# Patient Record
Sex: Male | Born: 1970 | Race: White | Hispanic: No | Marital: Single | Smoking: Never smoker
Health system: Southern US, Community
[De-identification: ages and names within clinical notes are randomized; demographics above are authoritative.]

## PROBLEM LIST (undated history)

## (undated) DIAGNOSIS — I10 Essential (primary) hypertension: Secondary | ICD-10-CM

## (undated) DIAGNOSIS — K7030 Alcoholic cirrhosis of liver without ascites: Secondary | ICD-10-CM

## (undated) DIAGNOSIS — D649 Anemia, unspecified: Secondary | ICD-10-CM

## (undated) DIAGNOSIS — D689 Coagulation defect, unspecified: Secondary | ICD-10-CM

## (undated) DIAGNOSIS — R188 Other ascites: Secondary | ICD-10-CM

## (undated) DIAGNOSIS — K7010 Alcoholic hepatitis without ascites: Secondary | ICD-10-CM

## (undated) HISTORY — PX: APPENDECTOMY: SHX54

---

## 2011-10-20 ENCOUNTER — Emergency Department (HOSPITAL_COMMUNITY)
Admission: EM | Admit: 2011-10-20 | Discharge: 2011-10-20 | Disposition: A | Discharge status: Home / Self Care | Payer: Self-pay | Attending: Emergency Medicine | Admitting: Emergency Medicine

## 2011-10-20 ENCOUNTER — Encounter (HOSPITAL_COMMUNITY): Payer: Self-pay

## 2011-10-20 DIAGNOSIS — E876 Hypokalemia: Secondary | ICD-10-CM | POA: Insufficient documentation

## 2011-10-20 DIAGNOSIS — R42 Dizziness and giddiness: Secondary | ICD-10-CM | POA: Insufficient documentation

## 2011-10-20 DIAGNOSIS — I10 Essential (primary) hypertension: Secondary | ICD-10-CM | POA: Insufficient documentation

## 2011-10-20 DIAGNOSIS — R110 Nausea: Secondary | ICD-10-CM | POA: Insufficient documentation

## 2011-10-20 HISTORY — DX: Essential (primary) hypertension: I10

## 2011-10-20 LAB — CBC WITH DIFFERENTIAL/PLATELET
Basophils Relative: 0 % (ref 0–1)
Hemoglobin: 15.6 g/dL (ref 13.0–17.0)
Lymphs Abs: 2.3 10*3/uL (ref 0.7–4.0)
MCHC: 36 g/dL (ref 30.0–36.0)
Monocytes Relative: 7 % (ref 3–12)
Neutro Abs: 8.4 10*3/uL — ABNORMAL HIGH (ref 1.7–7.7)
Neutrophils Relative %: 72 % (ref 43–77)
Platelets: 279 10*3/uL (ref 150–400)
RBC: 4.16 MIL/uL — ABNORMAL LOW (ref 4.22–5.81)

## 2011-10-20 LAB — BASIC METABOLIC PANEL
BUN: 18 mg/dL (ref 6–23)
Chloride: 92 mEq/L — ABNORMAL LOW (ref 96–112)
GFR calc Af Amer: >90 mL/min (ref >90)
Potassium: 3.2 mEq/L — ABNORMAL LOW (ref 3.5–5.1)
Sodium: 133 mEq/L — ABNORMAL LOW (ref 135–145)

## 2011-10-20 MED ORDER — ONDANSETRON HCL 4 MG/2ML IJ SOLN
4.0000 mg | Freq: Once | INTRAMUSCULAR | Status: AC
  Administered 2011-10-20: 4 mg via INTRAVENOUS
  Filled 2011-10-20: qty 2

## 2011-10-20 MED ORDER — SODIUM CHLORIDE 0.9 % IV BOLUS (SEPSIS)
1000.0000 mL | Freq: Once | INTRAVENOUS | Status: AC
  Administered 2011-10-20: 1000 mL via INTRAVENOUS

## 2011-10-20 MED ORDER — CLONIDINE HCL 0.2 MG PO TABS
0.2000 mg | ORAL_TABLET | Freq: Once | ORAL | Status: AC
  Administered 2011-10-20: 0.2 mg via ORAL
  Filled 2011-10-20: qty 1

## 2011-10-20 NOTE — ED Notes (Signed)
MD at bedside. 

## 2011-10-20 NOTE — ED Notes (Signed)
Pt c/o nausea for the past month off and on.  Reports today felt dizzy as well.  Pt vomited x 1 in ED.  Pt says feels like he gets overheated at work.  Pt also reports has had productive cough for the past month.  Denies chest pain.

## 2011-10-20 NOTE — ED Provider Notes (Signed)
History   . This chart was scribed for Donnetta Hutching, MD, MD by Smitty Pluck. The patient was seen in room APA06 and the patient's care was started at 12:56PM.   CSN: [id number]  Arrival date & time 10/20/11  1057   First MD Initiated Contact with Patient 10/20/11 1256      Chief Complaint  Patient presents with  . Dizziness  . Nausea    (Consider location/radiation/quality/duration/timing/severity/associated sxs/prior treatment) The history is provided by the patient. No language interpreter was used.   Colin Hodges is a 41 y.o. male who presents to the Emergency Department complaining of moderate dizziness at work today. Dizziness occurs when he stands up or moves. Pt reports lying down alleviates the dizziness. He reports shaking and sweating. Pt reports that he was working on hard wood floors when symptoms occurred. He awoke this AM feeling normal. Pt reports that he takes lisinopril (20-25 mg) for HTN. He did not take HTN medication 1 day ago. He states that he vomited today when he tried to take medication. Pt's current BP is 168/100.  Past Medical History  Diagnosis Date  . Hypertension     Past Surgical History  Procedure Date  . Appendectomy     No family history on file.  History  Substance Use Topics  . Smoking status: Never Smoker   . Smokeless tobacco: Not on file  . Alcohol Use: Yes     occ      Review of Systems  All other systems reviewed and are negative.   10 Systems reviewed and all are negative for acute change except as noted in the HPI.   Allergies  Review of patient's allergies indicates no known allergies.  Home Medications   Current Outpatient Rx  Name Route Sig Dispense Refill  . LISINOPRIL-HYDROCHLOROTHIAZIDE 20-25 MG PO TABS Oral Take 1 tablet by mouth daily.      BP 168/100  Pulse 109  Temp 98.1 F (36.7 C) (Oral)  Resp 20  Ht 5\' 10"  (1.778 m)  Wt 250 lb (113.399 kg)  BMI 35.87 kg/m2  SpO2 97%  Physical Exam  Nursing  note and vitals reviewed. Constitutional: He is oriented to person, place, and time. He appears well-developed and well-nourished.  HENT:  Head: Normocephalic and atraumatic.  Eyes: Conjunctivae and EOM are normal. Pupils are equal, round, and reactive to light.  Neck: Normal range of motion. Neck supple.  Cardiovascular: Normal rate, regular rhythm and normal heart sounds.   Pulmonary/Chest: Effort normal and breath sounds normal.  Abdominal: Soft. Bowel sounds are normal.  Musculoskeletal: Normal range of motion.  Neurological: He is alert and oriented to person, place, and time.  Skin: Skin is warm and dry.  Psychiatric: He has a normal mood and affect.    ED Course  Procedures (including critical care time) DIAGNOSTIC STUDIES: Oxygen Saturation is 97% on room air, normal by my interpretation.    COORDINATION OF CARE: 1:02 PM Discussed pt ED treatment with pt (clonidine, IV fluids, Zofran and labs)  1:07 PM Ordered:   Medications  lisinopril-hydrochlorothiazide (PRINZIDE,ZESTORETIC) 20-25 MG per tablet (not administered)  ondansetron (ZOFRAN) injection 4 mg (not administered)  sodium chloride 0.9 % bolus 1,000 mL (not administered)       Labs Reviewed  CBC WITH DIFFERENTIAL - Abnormal; Notable for the following:    WBC 11.6 (*)     RBC 4.16 (*)     MCV 104.1 (*)     MCH 37.5 (*)  Neutro Abs 8.4 (*)     All other components within normal limits  BASIC METABOLIC PANEL - Abnormal; Notable for the following:    Sodium 133 (*)     Potassium 3.2 (*)     Chloride 92 (*)     Glucose, Bld 135 (*)     All other components within normal limits  TROPONIN I   No results found.   No diagnosis found.  Date: 10/20/2011  Rate:111  Rhythm: sinus tachycardia  QRS Axis: normal  Intervals: normal  ST/T Wave abnormalities: normal  Conduction Disutrbances:none  Narrative Interpretation:   Old EKG Reviewed: none available    MDM  Recheck at 1500:  Feeling much better.  Blood pressure has reduced. Discussed taking blood pressure medicine correctly and reducing alcohol consumption. No clinical evidence of stroke  I personally performed the services described in this documentation, which was scribed in my presence. The recorded information has been reviewed and considered.        Donnetta Hutching, MD 10/20/11 [id number]

## 2011-10-20 NOTE — ED Notes (Signed)
Left in c/o father for transport home; instructions reviewed and f/u information provided.  In no distress; reports "a little" dizzy, but states is much-improved from earlier. A&ox4; answers questions appropriately; in no distress.

## 2013-02-09 DIAGNOSIS — R188 Other ascites: Secondary | ICD-10-CM

## 2013-02-09 DIAGNOSIS — K7030 Alcoholic cirrhosis of liver without ascites: Secondary | ICD-10-CM

## 2013-02-09 HISTORY — DX: Alcoholic cirrhosis of liver without ascites: K70.30

## 2013-02-09 HISTORY — DX: Other ascites: R18.8

## 2014-01-08 ENCOUNTER — Encounter (HOSPITAL_COMMUNITY): Payer: Self-pay

## 2014-01-08 ENCOUNTER — Inpatient Hospital Stay (HOSPITAL_COMMUNITY)
Admission: EM | Admit: 2014-01-08 | Discharge: 2014-01-14 | DRG: 377 | Disposition: A | Discharge status: Home / Self Care | Payer: Self-pay | Attending: Family Medicine | Admitting: Family Medicine

## 2014-01-08 ENCOUNTER — Inpatient Hospital Stay (HOSPITAL_COMMUNITY): Payer: Self-pay

## 2014-01-08 DIAGNOSIS — I864 Gastric varices: Secondary | ICD-10-CM | POA: Diagnosis present

## 2014-01-08 DIAGNOSIS — D7589 Other specified diseases of blood and blood-forming organs: Secondary | ICD-10-CM | POA: Diagnosis present

## 2014-01-08 DIAGNOSIS — K922 Gastrointestinal hemorrhage, unspecified: Secondary | ICD-10-CM | POA: Diagnosis present

## 2014-01-08 DIAGNOSIS — K766 Portal hypertension: Secondary | ICD-10-CM | POA: Diagnosis present

## 2014-01-08 DIAGNOSIS — D689 Coagulation defect, unspecified: Secondary | ICD-10-CM | POA: Diagnosis present

## 2014-01-08 DIAGNOSIS — I10 Essential (primary) hypertension: Secondary | ICD-10-CM | POA: Diagnosis present

## 2014-01-08 DIAGNOSIS — R17 Unspecified jaundice: Secondary | ICD-10-CM | POA: Diagnosis present

## 2014-01-08 DIAGNOSIS — R112 Nausea with vomiting, unspecified: Secondary | ICD-10-CM | POA: Diagnosis present

## 2014-01-08 DIAGNOSIS — R258 Other abnormal involuntary movements: Secondary | ICD-10-CM

## 2014-01-08 DIAGNOSIS — F1010 Alcohol abuse, uncomplicated: Secondary | ICD-10-CM | POA: Diagnosis present

## 2014-01-08 DIAGNOSIS — F1011 Alcohol abuse, in remission: Secondary | ICD-10-CM | POA: Diagnosis present

## 2014-01-08 DIAGNOSIS — Z79899 Other long term (current) drug therapy: Secondary | ICD-10-CM

## 2014-01-08 DIAGNOSIS — K769 Liver disease, unspecified: Secondary | ICD-10-CM

## 2014-01-08 DIAGNOSIS — R7989 Other specified abnormal findings of blood chemistry: Secondary | ICD-10-CM | POA: Insufficient documentation

## 2014-01-08 DIAGNOSIS — K3189 Other diseases of stomach and duodenum: Secondary | ICD-10-CM | POA: Diagnosis present

## 2014-01-08 DIAGNOSIS — K7011 Alcoholic hepatitis with ascites: Secondary | ICD-10-CM | POA: Insufficient documentation

## 2014-01-08 DIAGNOSIS — N390 Urinary tract infection, site not specified: Secondary | ICD-10-CM | POA: Diagnosis present

## 2014-01-08 DIAGNOSIS — K859 Acute pancreatitis, unspecified: Secondary | ICD-10-CM | POA: Diagnosis present

## 2014-01-08 DIAGNOSIS — K449 Diaphragmatic hernia without obstruction or gangrene: Secondary | ICD-10-CM | POA: Diagnosis present

## 2014-01-08 DIAGNOSIS — I471 Supraventricular tachycardia: Secondary | ICD-10-CM

## 2014-01-08 DIAGNOSIS — R945 Abnormal results of liver function studies: Secondary | ICD-10-CM

## 2014-01-08 DIAGNOSIS — K921 Melena: Principal | ICD-10-CM | POA: Diagnosis present

## 2014-01-08 DIAGNOSIS — K2270 Barrett's esophagus without dysplasia: Secondary | ICD-10-CM | POA: Diagnosis present

## 2014-01-08 DIAGNOSIS — R188 Other ascites: Secondary | ICD-10-CM | POA: Diagnosis present

## 2014-01-08 HISTORY — DX: Alcoholic hepatitis without ascites: K70.10

## 2014-01-08 LAB — CBC WITH DIFFERENTIAL/PLATELET
BASOS ABS: 0 10*3/uL (ref 0.0–0.1)
BASOS PCT: 0 % (ref 0–1)
BASOS PCT: 0 % (ref 0–1)
Basophils Absolute: 0 10*3/uL (ref 0.0–0.1)
EOS PCT: 0 % (ref 0–5)
EOS PCT: 0 % (ref 0–5)
Eosinophils Absolute: 0 10*3/uL (ref 0.0–0.7)
Eosinophils Absolute: 0 10*3/uL (ref 0.0–0.7)
HEMATOCRIT: 35.3 % — AB (ref 39.0–52.0)
HEMATOCRIT: 32.1 % — AB (ref 39.0–52.0)
Hemoglobin: 12.4 g/dL — ABNORMAL LOW (ref 13.0–17.0)
Hemoglobin: 11.3 g/dL — ABNORMAL LOW (ref 13.0–17.0)
LYMPHS PCT: 12 % (ref 12–46)
Lymphocytes Relative: 10 % — ABNORMAL LOW (ref 12–46)
Lymphs Abs: 1.3 10*3/uL (ref 0.7–4.0)
Lymphs Abs: 1.3 10*3/uL (ref 0.7–4.0)
MCH: 38.6 pg — ABNORMAL HIGH (ref 26.0–34.0)
MCH: 38.8 pg — ABNORMAL HIGH (ref 26.0–34.0)
MCHC: 35.1 g/dL (ref 30.0–36.0)
MCHC: 35.2 g/dL (ref 30.0–36.0)
MCV: 110 fL — AB (ref 78.0–100.0)
MCV: 110.3 fL — ABNORMAL HIGH (ref 78.0–100.0)
MONO ABS: 1 10*3/uL (ref 0.1–1.0)
MONO ABS: 0.8 10*3/uL (ref 0.1–1.0)
Monocytes Relative: 8 % (ref 3–12)
Monocytes Relative: 8 % (ref 3–12)
Neutro Abs: 10.4 10*3/uL — ABNORMAL HIGH (ref 1.7–7.7)
Neutro Abs: 8.3 10*3/uL — ABNORMAL HIGH (ref 1.7–7.7)
Neutrophils Relative %: 82 % — ABNORMAL HIGH (ref 43–77)
Neutrophils Relative %: 80 % — ABNORMAL HIGH (ref 43–77)
PLATELETS: 211 10*3/uL (ref 150–400)
Platelets: 228 10*3/uL (ref 150–400)
RBC: 3.21 MIL/uL — ABNORMAL LOW (ref 4.22–5.81)
RBC: 2.91 MIL/uL — ABNORMAL LOW (ref 4.22–5.81)
RDW: 14.8 % (ref 11.5–15.5)
RDW: 15 % (ref 11.5–15.5)
WBC: 12.7 10*3/uL — ABNORMAL HIGH (ref 4.0–10.5)
WBC: 10.4 10*3/uL (ref 4.0–10.5)

## 2014-01-08 LAB — URINALYSIS, ROUTINE W REFLEX MICROSCOPIC
GLUCOSE, UA: NEGATIVE mg/dL
Hgb urine dipstick: NEGATIVE
Ketones, ur: 15 mg/dL — AB
Nitrite: POSITIVE — AB
PH: 5.5 (ref 5.0–8.0)
PROTEIN: 30 mg/dL — AB
Specific Gravity, Urine: 1.037 — ABNORMAL HIGH (ref 1.005–1.030)
Urobilinogen, UA: 1 mg/dL (ref 0.0–1.0)

## 2014-01-08 LAB — COMPREHENSIVE METABOLIC PANEL
ALBUMIN: 2.7 g/dL — AB (ref 3.5–5.2)
ALT: 35 U/L (ref 0–53)
AST: 126 U/L — AB (ref 0–37)
Alkaline Phosphatase: 149 U/L — ABNORMAL HIGH (ref 39–117)
Anion gap: 19 — ABNORMAL HIGH (ref 5–15)
BUN: 11 mg/dL (ref 6–23)
CALCIUM: 8.8 mg/dL (ref 8.4–10.5)
CO2: 24 mEq/L (ref 19–32)
CREATININE: 0.45 mg/dL — AB (ref 0.50–1.35)
Chloride: 91 mEq/L — ABNORMAL LOW (ref 96–112)
GFR calc Af Amer: >90 mL/min (ref >90)
GFR calc non Af Amer: >90 mL/min (ref >90)
Glucose, Bld: 88 mg/dL (ref 70–99)
Potassium: 3.7 mEq/L (ref 3.7–5.3)
Sodium: 134 mEq/L — ABNORMAL LOW (ref 137–147)
TOTAL PROTEIN: 6.9 g/dL (ref 6.0–8.3)
Total Bilirubin: 26.5 mg/dL (ref 0.3–1.2)

## 2014-01-08 LAB — TYPE AND SCREEN
ABO/RH(D): A POS
Antibody Screen: NEGATIVE

## 2014-01-08 LAB — PROTIME-INR
INR: 2.01 — AB (ref 0.00–1.49)
PROTHROMBIN TIME: 23 s — AB (ref 11.6–15.2)

## 2014-01-08 LAB — LIPASE, BLOOD: LIPASE: 44 U/L (ref 11–59)

## 2014-01-08 LAB — LACTIC ACID, PLASMA: Lactic Acid, Venous: 4.9 mmol/L — ABNORMAL HIGH (ref 0.5–2.2)

## 2014-01-08 LAB — URINE MICROSCOPIC-ADD ON

## 2014-01-08 LAB — POC OCCULT BLOOD, ED: FECAL OCCULT BLD: POSITIVE — AB

## 2014-01-08 LAB — I-STAT CG4 LACTIC ACID, ED: Lactic Acid, Venous: 5.02 mmol/L — ABNORMAL HIGH (ref 0.5–2.2)

## 2014-01-08 MED ORDER — PANTOPRAZOLE SODIUM 40 MG IV SOLR
40.0000 mg | Freq: Once | INTRAVENOUS | Status: AC
  Administered 2014-01-08: 40 mg via INTRAVENOUS
  Filled 2014-01-08: qty 40

## 2014-01-08 MED ORDER — DEXTROSE 5 % IV SOLN
1.0000 g | INTRAVENOUS | Status: DC
  Administered 2014-01-08 – 2014-01-13 (×6): 1 g via INTRAVENOUS
  Filled 2014-01-08 (×6): qty 10

## 2014-01-08 MED ORDER — LORAZEPAM 2 MG/ML IJ SOLN
0.0000 mg | Freq: Four times a day (QID) | INTRAMUSCULAR | Status: AC
  Administered 2014-01-08 – 2014-01-09 (×3): 1 mg via INTRAVENOUS
  Filled 2014-01-08 (×3): qty 1

## 2014-01-08 MED ORDER — SODIUM CHLORIDE 0.9 % IJ SOLN
3.0000 mL | Freq: Two times a day (BID) | INTRAMUSCULAR | Status: DC
  Administered 2014-01-10 – 2014-01-13 (×5): 3 mL via INTRAVENOUS

## 2014-01-08 MED ORDER — LIDOCAINE HCL 1 % IJ SOLN
INTRAMUSCULAR | Status: AC
  Administered 2014-01-08: 2 mL
  Filled 2014-01-08: qty 20

## 2014-01-08 MED ORDER — CEFTRIAXONE SODIUM 250 MG IJ SOLR
250.0000 mg | Freq: Once | INTRAMUSCULAR | Status: DC
  Filled 2014-01-08: qty 250

## 2014-01-08 MED ORDER — SODIUM CHLORIDE 0.9 % IV SOLN
25.0000 ug/h | INTRAVENOUS | Status: DC
  Administered 2014-01-09 – 2014-01-13 (×6): 25 ug/h via INTRAVENOUS
  Filled 2014-01-08 (×13): qty 1

## 2014-01-08 MED ORDER — ONDANSETRON HCL 4 MG/2ML IJ SOLN
4.0000 mg | Freq: Four times a day (QID) | INTRAMUSCULAR | Status: DC | PRN
  Administered 2014-01-12 – 2014-01-13 (×2): 4 mg via INTRAVENOUS
  Filled 2014-01-08 (×2): qty 2

## 2014-01-08 MED ORDER — PANTOPRAZOLE SODIUM 40 MG IV SOLR
40.0000 mg | Freq: Two times a day (BID) | INTRAVENOUS | Status: DC
  Administered 2014-01-09 (×2): 40 mg via INTRAVENOUS
  Filled 2014-01-08 (×3): qty 40

## 2014-01-08 MED ORDER — LORAZEPAM 1 MG PO TABS: 1.0000 mg | ORAL_TABLET | Freq: Four times a day (QID) | ORAL | Status: AC | PRN

## 2014-01-08 MED ORDER — ONDANSETRON HCL 4 MG PO TABS: 4.0000 mg | ORAL_TABLET | Freq: Four times a day (QID) | ORAL | Status: DC | PRN

## 2014-01-08 MED ORDER — FOLIC ACID 1 MG PO TABS
1.0000 mg | ORAL_TABLET | Freq: Every day | ORAL | Status: DC
  Administered 2014-01-08 – 2014-01-14 (×6): 1 mg via ORAL
  Filled 2014-01-08 (×7): qty 1

## 2014-01-08 MED ORDER — THIAMINE HCL 100 MG/ML IJ SOLN
Freq: Every day | INTRAVENOUS | Status: AC
  Administered 2014-01-08 – 2014-01-10 (×3): via INTRAVENOUS
  Filled 2014-01-08 (×4): qty 1000

## 2014-01-08 MED ORDER — ONDANSETRON HCL 4 MG/2ML IJ SOLN
4.0000 mg | Freq: Once | INTRAMUSCULAR | Status: AC
  Administered 2014-01-08: 4 mg via INTRAVENOUS
  Filled 2014-01-08: qty 2

## 2014-01-08 MED ORDER — ADULT MULTIVITAMIN W/MINERALS CH
1.0000 | ORAL_TABLET | Freq: Every day | ORAL | Status: DC
  Administered 2014-01-08 – 2014-01-14 (×6): 1 via ORAL
  Filled 2014-01-08 (×7): qty 1

## 2014-01-08 MED ORDER — LORAZEPAM 2 MG/ML IJ SOLN
1.0000 mg | Freq: Four times a day (QID) | INTRAMUSCULAR | Status: AC | PRN
  Administered 2014-01-09 – 2014-01-11 (×2): 1 mg via INTRAVENOUS
  Filled 2014-01-08 (×4): qty 1

## 2014-01-08 MED ORDER — LORAZEPAM 2 MG/ML IJ SOLN
0.0000 mg | Freq: Two times a day (BID) | INTRAMUSCULAR | Status: AC
  Administered 2014-01-10 – 2014-01-11 (×2): 1 mg via INTRAVENOUS
  Administered 2014-01-11 – 2014-01-12 (×2): 2 mg via INTRAVENOUS
  Filled 2014-01-08: qty 1

## 2014-01-08 MED ORDER — SODIUM CHLORIDE 0.9 % IV BOLUS (SEPSIS)
1000.0000 mL | Freq: Once | INTRAVENOUS | Status: AC
  Administered 2014-01-08: 1000 mL via INTRAVENOUS

## 2014-01-08 NOTE — ED Provider Notes (Signed)
CSN: [id number]     Arrival date & time 01/08/14  1558 History   First MD Initiated Contact with Patient 01/08/14 1648     Chief Complaint  Patient presents with  . Melena  . Abdominal Pain     (Consider location/radiation/quality/duration/timing/severity/associated sxs/prior Treatment) HPI Colin Hodges is a 43 y.o. male history of hypertension, heavy drinking history, comes in for evaluation of sudden onset bloating, yellow skin, and vomiting. Patient states he used to drink about a half gallon each day until this past year and has reduced his intake to half a pint or a pint every other day. He reports recently over the past week developing ascites with associated nausea and vomiting. States every time he tries to eat "normal-sized meal" he throws it back up. She also reports his skin and eyes have turned yellow over the past few days. He reports abdominal pain in his epigastrium as well as dark stools and dark urine. Denies fevers, diarrhea, constipation.  Past Medical History  Diagnosis Date  . Hypertension    Past Surgical History  Procedure Laterality Date  . Appendectomy     History reviewed. No pertinent family history. History  Substance Use Topics  . Smoking status: Never Smoker   . Smokeless tobacco: Not on file  . Alcohol Use: Yes     Comment: occ    Review of Systems  Constitutional: Negative for fever.  HENT: Negative for sore throat.   Eyes: Negative for visual disturbance.  Respiratory: Negative for shortness of breath.   Cardiovascular: Negative for chest pain.  Gastrointestinal: Positive for nausea, vomiting, abdominal pain and abdominal distention.       Dark stools  Endocrine: Negative for polyuria.  Genitourinary: Negative for dysuria.       Dark urine.  Skin: Negative for rash.  Neurological: Negative for headaches.      Allergies  Review of patient's allergies indicates no known allergies.  Home Medications   Prior to Admission medications    Medication Sig Start Date End Date Taking? Authorizing Provider  Cholecalciferol (VITAMIN D3) 5000 UNITS TABS Take 1 tablet by mouth 3 (three) times a week.   Yes Historical Provider, MD  Glucosamine-Chondroit-Vit C-Mn (GLUCOSAMINE 1500 COMPLEX PO) Take 1 tablet by mouth daily.   Yes Historical Provider, MD  lisinopril-hydrochlorothiazide (PRINZIDE,ZESTORETIC) 20-25 MG per tablet Take 1 tablet by mouth daily.   Yes Historical Provider, MD   BP 135/64 mmHg  Pulse 119  Temp(Src) 98.2 F (36.8 C) (Oral)  Resp 18  Ht 5' 10"  (1.778 m)  Wt 225 lb (102.059 kg)  BMI 32.28 kg/m2  SpO2 98% Physical Exam  Constitutional: He is oriented to person, place, and time. He appears well-developed and well-nourished. No distress.  Patient is alert, GCS 15.  HENT:  Head: Normocephalic and atraumatic.  Mouth/Throat: Oropharynx is clear and moist.  Eyes: Pupils are equal, round, and reactive to light. Right eye exhibits no discharge. Left eye exhibits no discharge.  Scleral icterus present bilaterally  Neck: Normal range of motion. Neck supple.  Cardiovascular: Regular rhythm and normal heart sounds.  Exam reveals no gallop and no friction rub.   No murmur heard. Tachycardic   Pulmonary/Chest: Effort normal and breath sounds normal. No respiratory distress. He has no wheezes. He has no rales.  Abdominal: Soft.  Abdomen is distended, consistent with ascites. Tenderness in the epigastrium. No guarding, no palpable masses.  Genitourinary:  Dark stool on rectal exam. Pt does have hemorrhoids, but no frank blood  appreciated.  Musculoskeletal: He exhibits no tenderness.  Neurological: He is alert and oriented to person, place, and time.  Cranial Nerves II-XII grossly intact  Skin: Skin is warm and dry. No rash noted. He is not diaphoretic.  Jaundice  Psychiatric: He has a normal mood and affect.  Nursing note and vitals reviewed.   ED Course  Procedures (including critical care time) Labs Review Labs  Reviewed  CBC WITH DIFFERENTIAL - Abnormal; Notable for the following:    WBC 12.7 (*)    RBC 3.21 (*)    Hemoglobin 12.4 (*)    HCT 35.3 (*)    MCV 110.0 (*)    MCH 38.6 (*)    Neutrophils Relative % 82 (*)    Neutro Abs 10.4 (*)    Lymphocytes Relative 10 (*)    All other components within normal limits  COMPREHENSIVE METABOLIC PANEL - Abnormal; Notable for the following:    Sodium 134 (*)    Chloride 91 (*)    Creatinine, Ser 0.45 (*)    Albumin 2.7 (*)    AST 126 (*)    Alkaline Phosphatase 149 (*)    Total Bilirubin 26.5 (*)    Anion gap 19 (*)    All other components within normal limits  URINALYSIS, ROUTINE W REFLEX MICROSCOPIC - Abnormal; Notable for the following:    Color, Urine ORANGE (*)    APPearance CLOUDY (*)    Specific Gravity, Urine 1.037 (*)    Bilirubin Urine LARGE (*)    Ketones, ur 15 (*)    Protein, ur 30 (*)    Nitrite POSITIVE (*)    Leukocytes, UA MODERATE (*)    All other components within normal limits  URINE MICROSCOPIC-ADD ON - Abnormal; Notable for the following:    Bacteria, UA FEW (*)    Casts HYALINE CASTS (*)    All other components within normal limits  I-STAT CG4 LACTIC ACID, ED - Abnormal; Notable for the following:    Lactic Acid, Venous 5.02 (*)    All other components within normal limits  POC OCCULT BLOOD, ED - Abnormal; Notable for the following:    Fecal Occult Bld POSITIVE (*)    All other components within normal limits  LIPASE, BLOOD    Imaging Review No results found.   EKG Interpretation None      Meds given in ED:  Medications  cefTRIAXone (ROCEPHIN) injection 250 mg (not administered)  pantoprazole (PROTONIX) injection 40 mg (not administered)  sodium chloride 0.9 % bolus 1,000 mL (0 mLs Intravenous Stopped 01/08/14 1854)  pantoprazole (PROTONIX) injection 40 mg (40 mg Intravenous Given 01/08/14 1800)  ondansetron (ZOFRAN) injection 4 mg (4 mg Intravenous Given 01/08/14 1759)    New Prescriptions   No  medications on file   Filed Vitals:   01/08/14 1900 01/08/14 1915 01/08/14 1930 01/08/14 1936  BP: 136/64  135/64 135/64  Pulse: 119 119 119 119  Temp:      TempSrc:      Resp:    18  Height:      Weight:      SpO2: 95% 95% 96% 98%    MDM  Colin Hodges is a 43 y.o. male history of hypertension, heavy drinking history, comes in for evaluation of sudden onset bloating, yellow skin, vomiting, dark stool and urine.  Pt feels well at this time, no current N/V. Hemoccult pos, Lactic acid 5.02. Epigastric tenderness, jaundiced. No active vomiting or frank blood. Nitrite pos, Rocephin  given in ED. Total bili 26.5,   Spoke with Dr. Posey Pronto, will admit to tele. Requested we speak with GI. Spoke with Dr. Collene Mares, GI, does not believe octreotide is necessary at this point, 80 of Protonix received in ED is sufficient, recommends CT abdomen tomorrow for better appreciation of ascites.  Final diagnoses:  Gastrointestinal hemorrhage with melena  Ascites  Jaundice        Verl Dicker, PA-C 01/09/14 [place], MD 01/09/14 2340

## 2014-01-08 NOTE — ED Notes (Signed)
Floor Unit staff was notified of pt's bed assignment at this time.

## 2014-01-08 NOTE — H&P (Signed)
[facility] History and Physical  Patient: Colin Hodges  YDX:[id number]  DOB: 12-07-1970  DOS: the patient was seen and examined on 01/08/2014 PCP: No primary care provider on file.  Chief Complaint: Nausea vomiting, cough, stomach distention, skin color changes  HPI: Colin Hodges is a 43 y.o. male with Past medical history of hypertension and alcohol abuse. The patient presents with complaints of nausea vomiting cough stomach distention and paresthesias. He drinks half a pint to a pint and a daily basis of VODKA, his last drink was yesterday afternoon. He has been drinking heavily over last 20 years. And has not been able to eat anything since last few days and does not have an balanced diet at his baseline. He mentions since last 6 months he has noted distention of his stomach progressively getting worse gradually. He also has sand-like sensation in his legs for last few months staying the same. He has been trying to cut back on his alcohol and has been having jitteriness and tremors every now and then. He has noted that his skin has been yellow over last few weeks and since last 2 nights his eyes have been progressively yellow which made him to come to the hospital.  Since last month he has been having on and off episodes of nausea and vomiting and mentions he is unable to eat regular size meal and has to eat in small portions. He also has noted cough associated with vomiting since last few weeks. He denies any diarrhea and has 1-2 daily bowel motions with regular consistency but mentions the color as dark maroon. He also mentions his vomitus has been occasionally dark color as well as occasional darker sputum with the cough. Last night he has significant bout of cough and started having complaints of pain in his lower ribs both sides and the pain has progressively worsened. Pain feels like sharp muscle aches. He denies any similar episode in the past. He denies any drug abuse,  Tylenol abuse, use of NSAIDs, use of any prescription medications or herbal supplements, smoking  The patient is coming from home. And at his baseline independent for most of his ADL.  Review of Systems: as mentioned in the history of present illness.  A Comprehensive review of the other systems is negative.  Past Medical History  Diagnosis Date  . Hypertension    Past Surgical History  Procedure Laterality Date  . Appendectomy     Social History:  reports that he has never smoked. He does not have any smokeless tobacco history on file. He reports that he drinks alcohol. He reports that he does not use illicit drugs.  No Known Allergies  History reviewed. No pertinent family history.  Prior to Admission medications   Medication Sig Start Date End Date Taking? Authorizing Provider  Cholecalciferol (VITAMIN D3) 5000 UNITS TABS Take 1 tablet by mouth 3 (three) times a week.   Yes Historical Provider, MD  Glucosamine-Chondroit-Vit C-Mn (GLUCOSAMINE 1500 COMPLEX PO) Take 1 tablet by mouth daily.   Yes Historical Provider, MD  lisinopril-hydrochlorothiazide (PRINZIDE,ZESTORETIC) 20-25 MG per tablet Take 1 tablet by mouth daily.   Yes Historical Provider, MD    Physical Exam: Filed Vitals:   01/08/14 1915 01/08/14 1930 01/08/14 1936 01/08/14 2028  BP:  135/64 135/64 136/73  Pulse: 119 119 119 126  Temp:      TempSrc:      Resp:   18 18  Height:      Weight:  SpO2: 95% 96% 98% 96%    General: Alert, Awake and Oriented to Time, Place and Person. Appear in mild distress Eyes: PERRL icterus present ENT: Oral Mucosa clear moist. Neck: No JVD Cardiovascular: S1 and S2 Present, no Murmur, Peripheral Pulses Present Respiratory: Bilateral Air entry equal and Decreased, Clear to Auscultation, no Crackles, no wheezes Abdomen: Bowel Sound present sluggish, Soft and no tender Skin: No Rash Extremities: Trace Pedal edema, no calf tenderness Neurologic: Grossly no focal neuro deficit  other than tremors.   Labs on Admission:  CBC:  Recent Labs Lab 01/08/14 1645  WBC 12.7*  NEUTROABS 10.4*  HGB 12.4*  HCT 35.3*  MCV 110.0*  PLT 228    CMP     Component Value Date/Time   NA 134* 01/08/2014 1645   K 3.7 01/08/2014 1645   CL 91* 01/08/2014 1645   CO2 24 01/08/2014 1645   GLUCOSE 88 01/08/2014 1645   BUN 11 01/08/2014 1645   CREATININE 0.45* 01/08/2014 1645   CALCIUM 8.8 01/08/2014 1645   PROT 6.9 01/08/2014 1645   ALBUMIN 2.7* 01/08/2014 1645   AST 126* 01/08/2014 1645   ALT 35 01/08/2014 1645   ALKPHOS 149* 01/08/2014 1645   BILITOT 26.5* 01/08/2014 1645   GFRNONAA >90 01/08/2014 1645   GFRAA >90 01/08/2014 1645     Recent Labs Lab 01/08/14 1645  LIPASE 44   No results for input(s): AMMONIA in the last 168 hours.  No results for input(s): CKTOTAL, CKMB, CKMBINDEX, TROPONINI in the last 168 hours. BNP (last 3 results) No results for input(s): PROBNP in the last 8760 hours.  Radiological Exams on Admission: No results found.   Assessment/Plan Principal Problem:   GI bleed Active Problems:   Ascites   Alcohol abuse   Elevated bilirubin   Nausea & vomiting   UTI (urinary tract infection)   1. GI bleed  Liver disease Ascites Elevated bilirubin  The patient is presenting with episodes of nausea vomiting, dark maroon blood with Hemoccult-positive stool, elevated lactic acid, elevated bilirubin with history of significant alcohol abuse and jaundice. He does not appear to be having an ongoing active large GI bleed but may have a slow oozing. Gastroenterology has been consulted. He will be started on IV Protonix and IV octreotide drip. He'll be kept on clear liquid diet and nothing by mouth after midnight. Monitored on telemetry. I will check INR repeat hemoglobin as well as type and screen and recheck his lactic acid level. For his liver disease I would get an ultrasound of his abdomen as well as hepatitis panel. Further workup  depending on GI recommendation. Due to complaints of abdominal tenderness and possible ascites evident given IV ceftriaxone 2 cover him for SBP prophylaxis as well as for potential UTI. I would also check x-ray of the abdomen to rule out any free air.  2. Alcohol abuse. Sinus tachycardia Tremors  Most likely the cause of patient's current presentation. Patient recommended to avoid using alcohol. Patient wants to go through detoxification protocol. Currently he would be started on CIWA protocol and should be referred for detoxification once these acute issues are resolved.  3. Upper abdominal pain. Check x-ray abdomen check EKG.  Advance goals of care discussion: Full code   Consults: Gastroenterology  DVT Prophylaxis: mechanical compression device Nutrition: Clear liquid diet nothing by mouth after midnight  Disposition: Admitted to inpatient in telemetry unit.  Author: Berle Mull, MD [facility] Pager: [phone] 01/08/2014, 8:30 PM    If 7PM-7AM,  please contact night-coverage [email or web address] Password [email or web address]

## 2014-01-08 NOTE — Progress Notes (Signed)
Utilization Review completed.  Jacquel Redditt RN CM  

## 2014-01-08 NOTE — ED Notes (Signed)
Per pt, multiple complaints.  Feet sore from walking on hard wood floors.  Abdominal pain and bloating.  Black tarry stools.  Jaundice color.  Heavy drinker.  Hematuria.

## 2014-01-09 ENCOUNTER — Inpatient Hospital Stay (HOSPITAL_COMMUNITY): Payer: Self-pay

## 2014-01-09 ENCOUNTER — Encounter (HOSPITAL_COMMUNITY)
Admission: EM | Disposition: A | Discharge status: Home / Self Care | Payer: Self-pay | Source: Home / Self Care | Attending: Family Medicine

## 2014-01-09 ENCOUNTER — Encounter (HOSPITAL_COMMUNITY): Payer: Self-pay

## 2014-01-09 HISTORY — PX: ESOPHAGOGASTRODUODENOSCOPY: SHX5428

## 2014-01-09 LAB — FOLATE: FOLATE: 1.4 ng/mL — AB

## 2014-01-09 LAB — HEPATITIS PANEL, ACUTE
HCV Ab: NEGATIVE
HEP A IGM: NONREACTIVE
HEP B S AG: NEGATIVE
Hep B C IgM: NONREACTIVE

## 2014-01-09 LAB — VITAMIN B12: Vitamin B-12: 378 pg/mL (ref 211–911)

## 2014-01-09 LAB — BILIRUBIN, DIRECT: BILIRUBIN DIRECT: 17.4 mg/dL — AB (ref 0.0–0.3)

## 2014-01-09 LAB — ABO/RH: ABO/RH(D): A POS

## 2014-01-09 SURGERY — EGD (ESOPHAGOGASTRODUODENOSCOPY)
Anesthesia: Moderate Sedation

## 2014-01-09 MED ORDER — SODIUM CHLORIDE 0.9 % IV SOLN
Freq: Once | INTRAVENOUS | Status: AC
  Administered 2014-01-09: 10 mL/h via INTRAVENOUS

## 2014-01-09 MED ORDER — IOHEXOL 300 MG/ML  SOLN
25.0000 mL | INTRAMUSCULAR | Status: AC
Start: 2014-01-09 — End: 2014-01-09
  Administered 2014-01-09: 25 mL via ORAL

## 2014-01-09 MED ORDER — FUROSEMIDE 40 MG PO TABS
40.0000 mg | ORAL_TABLET | Freq: Every day | ORAL | Status: DC
  Administered 2014-01-09 – 2014-01-14 (×6): 40 mg via ORAL
  Filled 2014-01-09 (×6): qty 1

## 2014-01-09 MED ORDER — DIPHENHYDRAMINE HCL 50 MG/ML IJ SOLN
INTRAMUSCULAR | Status: AC
  Filled 2014-01-09: qty 1

## 2014-01-09 MED ORDER — MIDAZOLAM HCL 10 MG/2ML IJ SOLN
INTRAMUSCULAR | Status: AC
  Filled 2014-01-09: qty 2

## 2014-01-09 MED ORDER — FENTANYL CITRATE 0.05 MG/ML IJ SOLN
INTRAMUSCULAR | Status: AC
  Filled 2014-01-09: qty 2

## 2014-01-09 MED ORDER — FENTANYL CITRATE 0.05 MG/ML IJ SOLN
INTRAMUSCULAR | Status: DC | PRN
Start: 2014-01-09 — End: 2014-01-09
  Administered 2014-01-09 (×2): 25 ug via INTRAVENOUS

## 2014-01-09 MED ORDER — FUROSEMIDE 10 MG/ML IJ SOLN
20.0000 mg | Freq: Once | INTRAMUSCULAR | Status: AC
  Administered 2014-01-09: 20 mg via INTRAVENOUS
  Filled 2014-01-09: qty 2

## 2014-01-09 MED ORDER — PANTOPRAZOLE SODIUM 40 MG PO TBEC
40.0000 mg | DELAYED_RELEASE_TABLET | Freq: Two times a day (BID) | ORAL | Status: DC
  Administered 2014-01-10 – 2014-01-14 (×10): 40 mg via ORAL
  Filled 2014-01-09 (×11): qty 1

## 2014-01-09 MED ORDER — DIPHENHYDRAMINE HCL 50 MG/ML IJ SOLN
INTRAMUSCULAR | Status: DC | PRN
  Administered 2014-01-09: 25 mg via INTRAVENOUS

## 2014-01-09 MED ORDER — SPIRONOLACTONE 100 MG PO TABS
100.0000 mg | ORAL_TABLET | Freq: Every day | ORAL | Status: DC
  Administered 2014-01-09 – 2014-01-14 (×6): 100 mg via ORAL
  Filled 2014-01-09 (×6): qty 1

## 2014-01-09 MED ORDER — MIDAZOLAM HCL 10 MG/2ML IJ SOLN
INTRAMUSCULAR | Status: DC | PRN
  Administered 2014-01-09 (×2): 2 mg via INTRAVENOUS

## 2014-01-09 MED ORDER — METHYLPREDNISOLONE SODIUM SUCC 40 MG IJ SOLR
40.0000 mg | INTRAMUSCULAR | Status: DC
  Administered 2014-01-09 – 2014-01-13 (×5): 40 mg via INTRAVENOUS
  Filled 2014-01-09 (×6): qty 1

## 2014-01-09 MED ORDER — IOHEXOL 300 MG/ML  SOLN
100.0000 mL | Freq: Once | INTRAMUSCULAR | Status: AC | PRN
  Administered 2014-01-09: 100 mL via INTRAVENOUS

## 2014-01-09 NOTE — Op Note (Signed)
[facility] [place], [address]   ENDOSCOPY PROCEDURE REPORT  PATIENT: Colin, Hodges  MR#: [id number] BIRTHDATE: 02-23-1970 , 43  yrs. old GENDER: male ENDOSCOPIST:Derian Pfost Benson [place], MD REFERRED BY: PROCEDURE DATE:  January 26, 2014 PROCEDURE:   EGD, diagnostic ASA CLASS:    Class III INDICATIONS: ? Variceal bleed MEDICATION: Versed 4 mg IV, Fentanyl 50 mcg IV, and Benadryl 25 mg IV TOPICAL ANESTHETIC:   Cetacaine Spray  DESCRIPTION OF PROCEDURE:   After the risks and benefits of the procedure were explained, informed consent was obtained.  The Pentax Gastroscope [id number]  endoscope was introduced through the mouth  and advanced to the second portion of the duodenum .  The instrument was slowly withdrawn as the mucosa was fully examined.   FINDINGS: Visualization was hampered with the patient's wretching. In the mid esohpagus the Z-line was identified.  He had gross evidence of Barrett's esophagus.  No evidence of any esophageal varices.  In the distal esohpagus a 3-4 cm hiatal hernia was identified.  Upon entry in to the gastric lumen there was evidence of some blood and clots.  This was suctioned and careful inspection was negative for any evidence of bleeding, however, the gastric mucosa was very friable.  Rapid oozing was noted and this may be the source of the bleeing compounded by his coagulopathy.  Severe portal HTN gastropathy was noted.  The duodenum was normal.  . 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  The varix was non bleeding and there did not appear to be any stigmata of bleeding from this site.  The varix prolapsed into the esophagus with his wretching.    The scope was then withdrawn from the patient and the procedure completed.  COMPLICATIONS: There were no immediate complications.  ENDOSCOPIC IMPRESSION: 1) Gross evidence of long segment Barrett's esophagus. 2) No esophageal varices. 3) 3-4 cm hiatal hernia. 4) GOV  type 2. 5) Severe portal HTN gastropathy. RECOMMENDATIONS: 1) Low salt diet. 2) Follow HGB. 3) If severe bleeding occurs treatment options are extremely limited as his current MELD is 27.  This precludes a TIPS, however, a BRTO may be attempted.  Unfortunately this will worsen his ascites.    _______________________________ eSignedCarol Ada, MD January 26, 2014 3:50 PM     cc:  CPT CODES: ICD CODES:  The ICD and CPT codes recommended by this software are interpretations from the data that the clinical staff has captured with the software.  The verification of the translation of this report to the ICD and CPT codes and modifiers is the sole responsibility of the health care institution and practicing physician where this report was generated.  [employer] will not be held responsible for the validity of the ICD and CPT codes included on this report.  AMA assumes no liability for data contained or not contained herein. CPT is a Designer, television/film set of the [employer].  PATIENT NAME:  Colin, Hodges MR#: [id number]

## 2014-01-09 NOTE — Plan of Care (Signed)
Problem: Phase I Progression Outcomes Goal: Voiding-avoid urinary catheter unless indicated Outcome: Completed/Met Date Met:  01/09/14     

## 2014-01-09 NOTE — Progress Notes (Signed)
[facility] PROGRESS NOTE  Colin Hodges MBW:[id number] DOB: Dec 23, 1970 DOA: 01/08/2014 PCP: No primary care provider on file.  Assessment/Plan: 1-Melena; coffee ground Emesis;  Continue with octreotide, Protonix and ceftriaxone for SBP prophylaxis.  Endoscopy today.  Will give 2 units FFP.   Alcoholic Hepatitis; hyperbilirubinemia. Ascites.  GI consulted. Started on steroids. Appreciate Dr Colin Hodges assistance.  Follow LFT.  CT abdomen showed: portal venous HTN, enlarged liver with diffuse hepatic steatosis. Focal pancreatitis.  Started on spironolactone.  Hepatitis panel negative.   Alcohol use; Continue with CIWA protocol.   UTI;Continue with ceftriaxone.   Screening for HIV.    Code Status: Full Code.  Family Communication: Care discussed with patient Disposition Plan: remain inpatient.     Consultants:  GI  Procedures:  Endoscopy  Antibiotics:  Ceftriaxone.   HPI/Subjective: He presents with jaundice, melena,  worsening abdominal distension for 1 week.    Objective: Filed Vitals:   01/09/14 0439  BP: 126/66  Pulse: 105  Temp: 98.3 F (36.8 C)  Resp: 18    Intake/Output Summary (Last 24 hours) at 01/09/14 1025 Last data filed at 01/09/14 0655  Gross per 24 hour  Intake 2283.12 ml  Output    250 ml  Net 2033.12 ml   Filed Weights   01/08/14 1613 01/08/14 2127 01/09/14 0439  Weight: 102.059 kg (225 lb) 102.74 kg (226 lb 8 oz) 103.7 kg (228 lb 9.9 oz)    Exam:   General:  Alert in no distress, icteric.   Cardiovascular: S 1, S 2 RRR  Respiratory: CTA  Abdomen: Distended, no tenderness.   Musculoskeletal: trace edema.   Data Reviewed: Basic Metabolic Panel:  Recent Labs Lab 01/08/14 1645  NA 134*  K 3.7  CL 91*  CO2 24  GLUCOSE 88  BUN 11  CREATININE 0.45*  CALCIUM 8.8   Liver Function Tests:  Recent Labs Lab 01/08/14 1645  AST 126*  ALT 35  ALKPHOS 149*  BILITOT 26.5*  PROT 6.9  ALBUMIN 2.7*    Recent  Labs Lab 01/08/14 1645  LIPASE 44   No results for input(s): AMMONIA in the last 168 hours. CBC:  Recent Labs Lab 01/08/14 1645 01/08/14 2029  WBC 12.7* 10.4  NEUTROABS 10.4* 8.3*  HGB 12.4* 11.3*  HCT 35.3* 32.1*  MCV 110.0* 110.3*  PLT 228 211   Cardiac Enzymes: No results for input(s): CKTOTAL, CKMB, CKMBINDEX, TROPONINI in the last 168 hours. BNP (last 3 results) No results for input(s): PROBNP in the last 8760 hours. CBG: No results for input(s): GLUCAP in the last 168 hours.  No results found for this or any previous visit (from the past 240 hour(s)).   Studies: Dg Abd Acute W/chest  01/08/2014   CLINICAL DATA:  Ascites and elevated bilirubin.  EXAM: ACUTE ABDOMEN SERIES (ABDOMEN 2 VIEW & CHEST 1 VIEW)  COMPARISON:  None.  FINDINGS: Lungs are clear bilaterally. Heart size is normal. The trachea is midline. No evidence for free air. There is a round density in the left abdomen on the upright abdominal view but this is not clearly seen on the supine images. This density could be within bowel. Few gas-filled loops of small bowel. There is a small amount of gas and stool in the rectal region.  IMPRESSION: No acute chest findings.  Nonspecific bowel gas pattern.   Electronically Signed   By: Colin Hodges M.D.   On: 01/08/2014 21:24    Scheduled Meds: . sodium chloride   Intravenous Once  .  cefTRIAXone (ROCEPHIN)  IV  1 g Intravenous Q24H  . folic acid  1 mg Oral Daily  . LORazepam  0-4 mg Intravenous Q6H   Followed by  . [START ON 01/10/2014] LORazepam  0-4 mg Intravenous Q12H  . multivitamin with minerals  1 tablet Oral Daily  . pantoprazole (PROTONIX) IV  40 mg Intravenous Q12H  . banana bag IV 1000 mL   Intravenous Daily  . sodium chloride  3 mL Intravenous Q12H   Continuous Infusions: . octreotide  (SANDOSTATIN)    IV infusion 25 mcg/hr (01/09/14 0128)    Principal Problem:   GI bleed Active Problems:   Ascites   Alcohol abuse   Elevated bilirubin   Nausea &  vomiting   UTI (urinary tract infection)    Time spent: 35 minutes.     Colin Hodges A  [facility] Pager [phone]. If 7PM-7AM, please contact night-coverage at [email or web address], password [facility] 01/09/2014, 10:25 AM  LOS: 1 day

## 2014-01-09 NOTE — Plan of Care (Signed)
Problem: Phase I Progression Outcomes Goal: OOB as tolerated unless otherwise ordered Outcome: Completed/Met Date Met:  01/09/14

## 2014-01-09 NOTE — Consult Note (Signed)
Unassigned Consult Note  Reason for Consult: ETOH liver disease, ? Variceal Bleed Referring Physician: [facility]  [place] HPI: This is a 43 year old alcoholic admitted to the hospital for nausea, vomiting, jaundice, and abdominal distension.  He has a long history of ETOH abuse, but within the past few weeks to months he has tried to cut back on his ETOH.  In the past he would intermittently have melena that averaged once per month.  No reports of hematemesis or hematochezia.  His last bout of bleeding in the form of maroon colored stools was 3-4 days ago when he turned jaundice.  It was also during this time that he reported issues with some very mild coffee-ground emesis.  As a result of his symptoms he presented to the ER.  In the past when he tried to wean down on ETOH he developed shaking.  Work up in the ER revealed a drop in his HGB from 15.6 down to 11.3 with IV hydration.  His platelets were normal and he has macrocytosis.  The abdominal CT scan reveals moderate ascites and a splenorenal shunt consistent with portal HTN.  His liver was steatotic, but no over signs of cirrhosis.  Past Medical History  Diagnosis Date  . Hypertension   . Alcoholic hepatitis     Past Surgical History  Procedure Laterality Date  . Appendectomy      History reviewed. No pertinent family history.  Social History:  reports that he has never smoked. He does not have any smokeless tobacco history on file. He reports that he drinks alcohol. He reports that he does not use illicit drugs.  Allergies: No Known Allergies  Medications:  Scheduled: . cefTRIAXone (ROCEPHIN)  IV  1 g Intravenous Q24H  . folic acid  1 mg Oral Daily  . LORazepam  0-4 mg Intravenous Q6H   Followed by  . [START ON 01/10/2014] LORazepam  0-4 mg Intravenous Q12H  . multivitamin with minerals  1 tablet Oral Daily  . pantoprazole (PROTONIX) IV  40 mg Intravenous Q12H  . banana bag IV 1000 mL   Intravenous Daily  . sodium  chloride  3 mL Intravenous Q12H   Continuous: . octreotide  (SANDOSTATIN)    IV infusion 25 mcg/hr (01/09/14 0128)    Results for orders placed or performed during the hospital encounter of 01/08/14 (from the past 24 hour(s))  CBC with Differential     Status: Abnormal   Collection Time: 01/08/14  4:45 PM  Result Value Ref Range   WBC 12.7 (H) 4.0 - 10.5 K/uL   RBC 3.21 (L) 4.22 - 5.81 MIL/uL   Hemoglobin 12.4 (L) 13.0 - 17.0 g/dL   HCT 35.3 (L) 39.0 - 52.0 %   MCV 110.0 (H) 78.0 - 100.0 fL   MCH 38.6 (H) 26.0 - 34.0 pg   MCHC 35.1 30.0 - 36.0 g/dL   RDW 14.8 11.5 - 15.5 %   Platelets 228 150 - 400 K/uL   Neutrophils Relative % 82 (H) 43 - 77 %   Neutro Abs 10.4 (H) 1.7 - 7.7 K/uL   Lymphocytes Relative 10 (L) 12 - 46 %   Lymphs Abs 1.3 0.7 - 4.0 K/uL   Monocytes Relative 8 3 - 12 %   Monocytes Absolute 1.0 0.1 - 1.0 K/uL   Eosinophils Relative 0 0 - 5 %   Eosinophils Absolute 0.0 0.0 - 0.7 K/uL   Basophils Relative 0 0 - 1 %   Basophils Absolute  0.0 0.0 - 0.1 K/uL  Comprehensive metabolic panel     Status: Abnormal   Collection Time: 01/08/14  4:45 PM  Result Value Ref Range   Sodium 134 (L) 137 - 147 mEq/L   Potassium 3.7 3.7 - 5.3 mEq/L   Chloride 91 (L) 96 - 112 mEq/L   CO2 24 19 - 32 mEq/L   Glucose, Bld 88 70 - 99 mg/dL   BUN 11 6 - 23 mg/dL   Creatinine, Ser 0.45 (L) 0.50 - 1.35 mg/dL   Calcium 8.8 8.4 - 10.5 mg/dL   Total Protein 6.9 6.0 - 8.3 g/dL   Albumin 2.7 (L) 3.5 - 5.2 g/dL   AST 126 (H) 0 - 37 U/L   ALT 35 0 - 53 U/L   Alkaline Phosphatase 149 (H) 39 - 117 U/L   Total Bilirubin 26.5 (HH) 0.3 - 1.2 mg/dL   GFR calc non Af Amer >90 >90 mL/min   GFR calc Af Amer >90 >90 mL/min   Anion gap 19 (H) 5 - 15  Lipase, blood     Status: None   Collection Time: 01/08/14  4:45 PM  Result Value Ref Range   Lipase 44 11 - 59 U/L  I-Stat CG4 Lactic Acid, ED     Status: Abnormal   Collection Time: 01/08/14  5:22 PM  Result Value Ref Range   Lactic Acid, Venous  5.02 (H) 0.5 - 2.2 mmol/L  POC occult blood, ED Provider will collect     Status: Abnormal   Collection Time: 01/08/14  6:01 PM  Result Value Ref Range   Fecal Occult Bld POSITIVE (A) NEGATIVE  Urinalysis, Routine w reflex microscopic     Status: Abnormal   Collection Time: 01/08/14  6:02 PM  Result Value Ref Range   Color, Urine ORANGE (A) YELLOW   APPearance CLOUDY (A) CLEAR   Specific Gravity, Urine 1.037 (H) 1.005 - 1.030   pH 5.5 5.0 - 8.0   Glucose, UA NEGATIVE NEGATIVE mg/dL   Hgb urine dipstick NEGATIVE NEGATIVE   Bilirubin Urine LARGE (A) NEGATIVE   Ketones, ur 15 (A) NEGATIVE mg/dL   Protein, ur 30 (A) NEGATIVE mg/dL   Urobilinogen, UA 1.0 0.0 - 1.0 mg/dL   Nitrite POSITIVE (A) NEGATIVE   Leukocytes, UA MODERATE (A) NEGATIVE  Urine microscopic-add on     Status: Abnormal   Collection Time: 01/08/14  6:02 PM  Result Value Ref Range   Squamous Epithelial / LPF RARE RARE   WBC, UA 11-20 <3 WBC/hpf   Bacteria, UA FEW (A) RARE   Casts HYALINE CASTS (A) NEGATIVE   Urine-Other MUCOUS PRESENT   CBC with Differential     Status: Abnormal   Collection Time: 01/08/14  8:29 PM  Result Value Ref Range   WBC 10.4 4.0 - 10.5 K/uL   RBC 2.91 (L) 4.22 - 5.81 MIL/uL   Hemoglobin 11.3 (L) 13.0 - 17.0 g/dL   HCT 32.1 (L) 39.0 - 52.0 %   MCV 110.3 (H) 78.0 - 100.0 fL   MCH 38.8 (H) 26.0 - 34.0 pg   MCHC 35.2 30.0 - 36.0 g/dL   RDW 15.0 11.5 - 15.5 %   Platelets 211 150 - 400 K/uL   Neutrophils Relative % 80 (H) 43 - 77 %   Neutro Abs 8.3 (H) 1.7 - 7.7 K/uL   Lymphocytes Relative 12 12 - 46 %   Lymphs Abs 1.3 0.7 - 4.0 K/uL   Monocytes Relative 8 3 -  12 %   Monocytes Absolute 0.8 0.1 - 1.0 K/uL   Eosinophils Relative 0 0 - 5 %   Eosinophils Absolute 0 0.0 - 0.7 K/uL   Basophils Relative 0 0 - 1 %   Basophils Absolute 0 0.0 - 0.1 K/uL   WBC Morphology HYPERSEGMENTED NEUT    RBC Morphology HOWELL/JOLLY BODIES   Protime-INR     Status: Abnormal   Collection Time: 01/08/14  8:29  PM  Result Value Ref Range   Prothrombin Time 23.0 (H) 11.6 - 15.2 seconds   INR 2.01 (H) 0.00 - 1.49  Hepatitis panel, acute     Status: None   Collection Time: 01/08/14  8:29 PM  Result Value Ref Range   Hepatitis B Surface Ag NEGATIVE NEGATIVE   HCV Ab NEGATIVE NEGATIVE   Hep A IgM NON REACTIVE NON REACTIVE   Hep B C IgM NON REACTIVE NON REACTIVE  Bilirubin, direct     Status: Abnormal   Collection Time: 01/08/14  8:29 PM  Result Value Ref Range   Bilirubin, Direct 17.4 (H) 0.0 - 0.3 mg/dL  Vitamin B12     Status: None   Collection Time: 01/08/14  8:29 PM  Result Value Ref Range   Vitamin B-12 378 211 - 911 pg/mL  Folate     Status: Abnormal   Collection Time: 01/08/14  8:29 PM  Result Value Ref Range   Folate 1.4 (L) ng/mL  Lactic acid, plasma     Status: Abnormal   Collection Time: 01/08/14  8:30 PM  Result Value Ref Range   Lactic Acid, Venous 4.9 (H) 0.5 - 2.2 mmol/L  Type and screen     Status: None   Collection Time: 01/08/14  9:46 PM  Result Value Ref Range   ABO/RH(D) A POS    Antibody Screen NEG    Sample Expiration 01/11/2014   ABO/Rh     Status: None   Collection Time: 01/08/14  9:46 PM  Result Value Ref Range   ABO/RH(D) A POS   Prepare fresh frozen plasma     Status: None (Preliminary result)   Collection Time: 01/09/14 10:00 AM  Result Value Ref Range   Unit Number [id number]    Blood Component Type THW PLS APHR    Unit division C0    Status of Unit ISSUED    Transfusion Status OK TO TRANSFUSE    Unit Number [id number]    Blood Component Type THW PLS APHR    Unit division C0    Status of Unit ALLOCATED    Transfusion Status OK TO TRANSFUSE      Ct Abdomen Pelvis W Contrast  01/09/2014   CLINICAL DATA:  Nausea, vomiting, cough, abdominal pain and distention, jaundice, elevated bilirubin, bloody stools, history of ethanol abuse, hypertension, alcoholic hepatitis  EXAM: CT ABDOMEN AND PELVIS WITH CONTRAST  TECHNIQUE: Multidetector CT imaging of  the abdomen and pelvis was performed using the standard protocol following bolus administration of intravenous contrast. Sagittal and coronal MPR images reconstructed from axial data set.  CONTRAST:  126m OMNIPAQUE IOHEXOL 300 MG/ML SOLN IV. Dilute oral contrast.  COMPARISON:  None  FINDINGS: Bibasilar atelectasis.  Suboptimal arterial phase, and delayed imaging versus contrast opacification.  Diffuse fatty infiltration of an enlarged liver without focal mass.  Spleen, kidneys, and adrenal glands normal appearance.  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  Additional collaterals are noted in the anterior abdomen as well.  Small hiatal hernia.  Edema identified at the second and third portions of the duodenum extending into proximal jejunum and small bowel mesentery, associated with mild enlargement and edematous changes of the pancreatic head suggesting focal pancreatitis.  Pancreatic body and tail grossly unremarkable.  Major venous structures patent.  Fluid/thickening of the LEFT anterior pararenal and lateral conal fascia.  Mild gallbladder wall thickening, nonspecific in setting of ascites in abdomen and pelvis.  BILATERAL inguinal hernias containing fat and fluid.  Unable to exclude colonic wall thickening,colon unopacified and under distended.  Remaining small bowel loops normal.  Minimal bladder wall thickening.  No mass, adenopathy, free air, or acute osseous findings.  IMPRESSION: Enlarged liver with diffuse hepatic steatosis and ascites.  No gross focal hepatic mass identified though examination is suboptimal due to lack of arterial phase contrast.  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  Suspected focal pancreatitis involving the pancreatic head with associated edema of the second and third portions of the duodenum and fluid in pararenal space into mesentery ; correlation with serum amylase  recommended.  Small hiatal and BILATERAL inguinal hernias.   Electronically Signed   By: Lavonia Dana M.D.   On: 01/09/2014 11:24   Dg Abd Acute W/chest  01/08/2014   CLINICAL DATA:  Ascites and elevated bilirubin.  EXAM: ACUTE ABDOMEN SERIES (ABDOMEN 2 VIEW & CHEST 1 VIEW)  COMPARISON:  None.  FINDINGS: Lungs are clear bilaterally. Heart size is normal. The trachea is midline. No evidence for free air. There is a round density in the left abdomen on the upright abdominal view but this is not clearly seen on the supine images. This density could be within bowel. Few gas-filled loops of small bowel. There is a small amount of gas and stool in the rectal region.  IMPRESSION: No acute chest findings.  Nonspecific bowel gas pattern.   Electronically Signed   By: Markus Daft M.D.   On: 01/08/2014 21:24    ROS:  As stated above in the HPI otherwise negative.  Blood pressure 139/80, pulse 110, temperature 98.5 F (36.9 C), temperature source Oral, resp. rate 18, height 5' 10"  (1.778 m), weight 103.7 kg (228 lb 9.9 oz), SpO2 97 %.    PE: Gen: NAD, Alert and Oriented, Jaundiced HEENT:  [place]/AT, EOMI Neck: Supple, no LAD Lungs: CTA Bilaterally CV: RRR without M/G/R ABM: Soft, distended with ascites, firm, no respiratory compromise, small to moderate umbilical hernia, +BS Ext: No C/C/E  Assessment/Plan: 1) ETOH hepatitis. 2) ? Variceal bleed. 3) Decompensated ETOH liver disease.   The patient's DI is 64.2.  His bilirubin is markedly elevated.  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  If his TB does not decrease in the next 3-5 days, this is a poor prognostic indicator.  No overt evidence of cirrhosis, but patient's who have ETOH hepatitis have a 50% chance of developing cirrhosis.  His bleeding does not appear to be severe and his HGB dropped down from IV hydration, however, it is prudent to evaluate him with an EGD and band if necessary.  Plan: 1) Steroids QD x 1  month. 2) EGD today. 3) Low sodium diet 4) Spironolactone 100 mg QD and Lasix 40 mg QD. 5) Monitor for signs of ETOH withdrawal. 6) Agree with ceftriaxone and octreotide.  Habiba Treloar D 01/09/2014, 12:27 PM

## 2014-01-10 ENCOUNTER — Encounter (HOSPITAL_COMMUNITY): Payer: Self-pay | Admitting: Gastroenterology

## 2014-01-10 LAB — BASIC METABOLIC PANEL
ANION GAP: 14 (ref 5–15)
BUN: 12 mg/dL (ref 6–23)
CHLORIDE: 98 meq/L (ref 96–112)
CO2: 27 mEq/L (ref 19–32)
Calcium: 8 mg/dL — ABNORMAL LOW (ref 8.4–10.5)
Creatinine, Ser: 0.51 mg/dL (ref 0.50–1.35)
Glucose, Bld: 206 mg/dL — ABNORMAL HIGH (ref 70–99)
Potassium: 4.4 mEq/L (ref 3.7–5.3)
Sodium: 139 mEq/L (ref 137–147)

## 2014-01-10 LAB — PREPARE FRESH FROZEN PLASMA

## 2014-01-10 LAB — LACTIC ACID, PLASMA: LACTIC ACID, VENOUS: 3.4 mmol/L — AB (ref 0.5–2.2)

## 2014-01-10 LAB — CBC
HCT: 32.1 % — ABNORMAL LOW (ref 39.0–52.0)
HCT: 31.1 % — ABNORMAL LOW (ref 39.0–52.0)
Hemoglobin: 11.4 g/dL — ABNORMAL LOW (ref 13.0–17.0)
Hemoglobin: 10.9 g/dL — ABNORMAL LOW (ref 13.0–17.0)
MCH: 39.9 pg — ABNORMAL HIGH (ref 26.0–34.0)
MCH: 38.8 pg — AB (ref 26.0–34.0)
MCHC: 35.5 g/dL (ref 30.0–36.0)
MCHC: 35 g/dL (ref 30.0–36.0)
MCV: 112.2 fL — ABNORMAL HIGH (ref 78.0–100.0)
MCV: 110.7 fL — ABNORMAL HIGH (ref 78.0–100.0)
PLATELETS: 206 10*3/uL (ref 150–400)
PLATELETS: 210 10*3/uL (ref 150–400)
RBC: 2.86 MIL/uL — AB (ref 4.22–5.81)
RBC: 2.81 MIL/uL — AB (ref 4.22–5.81)
RDW: 15.2 % (ref 11.5–15.5)
RDW: 15 % (ref 11.5–15.5)
WBC: 7.4 10*3/uL (ref 4.0–10.5)
WBC: 11.8 10*3/uL — ABNORMAL HIGH (ref 4.0–10.5)

## 2014-01-10 LAB — HIV ANTIBODY (ROUTINE TESTING W REFLEX): HIV: NONREACTIVE

## 2014-01-10 LAB — URINE CULTURE
COLONY COUNT: NO GROWTH
Culture: NO GROWTH

## 2014-01-10 LAB — HEPATIC FUNCTION PANEL
ALT: 32 U/L (ref 0–53)
AST: 104 U/L — ABNORMAL HIGH (ref 0–37)
Albumin: 2.3 g/dL — ABNORMAL LOW (ref 3.5–5.2)
Alkaline Phosphatase: 119 U/L — ABNORMAL HIGH (ref 39–117)
BILIRUBIN DIRECT: 18.6 mg/dL — AB (ref 0.0–0.3)
BILIRUBIN TOTAL: 23.5 mg/dL — AB (ref 0.3–1.2)
Indirect Bilirubin: 4.9 mg/dL — ABNORMAL HIGH (ref 0.3–0.9)
Total Protein: 5.9 g/dL — ABNORMAL LOW (ref 6.0–8.3)

## 2014-01-10 LAB — PROTIME-INR
INR: 1.9 — AB (ref 0.00–1.49)
PROTHROMBIN TIME: 21.9 s — AB (ref 11.6–15.2)

## 2014-01-10 NOTE — Progress Notes (Signed)
[facility] PROGRESS NOTE  Colin Hodges POE:[id number] DOB: [birth year] DOA: 01/08/2014 PCP: No primary care provider on file.  Assessment/Plan:  Melena; coffee ground Emesis;  Continue with octreotide, Protonix and ceftriaxone for SBP prophylaxis.  Endoscopy showed no esophageal varices, 3-4 cm hiatal hernia, portal hypertension gastropathy, gross evidence of long segment Barrett's esophagus. No more coffee-ground emesis. Will continue to monitor hemoglobin  Alcoholic Hepatitis; Hyperbilirubinemia, ascites.  GI consulted. Started on steroids.  Follow LFT.  CT abdomen showed: portal venous HTN, enlarged liver with diffuse hepatic steatosis. Focal pancreatitis.  Started on spironolactone.  Hepatitis panel negative.  GI following  Alcohol use;  Continue with CIWA protocol.  No signs or symptoms of alcohol withdrawal  UTI; Continue with ceftriaxone.  Urine culture is pending     Code Status: Full Code.  Family Communication: Care discussed with patient Disposition Plan: remain inpatient.     Consultants:  GI  Procedures:  Endoscopy  Antibiotics:  Ceftriaxone.   HPI/Subjective: Patient denies nausea and vomiting. Status post EGD done yesterday.   Objective: Filed Vitals:   01/10/14 1004  BP: 133/81  Pulse: 94  Temp: 98.1 F (36.7 C)  Resp:     Intake/Output Summary (Last 24 hours) at 01/10/14 1333 Last data filed at 01/10/14 3154  Gross per 24 hour  Intake   3488 ml  Output      0 ml  Net   3488 ml   Filed Weights   01/08/14 2127 01/09/14 0439 01/10/14 0535  Weight: 102.74 kg (226 lb 8 oz) 103.7 kg (228 lb 9.9 oz) 106.232 kg (234 lb 3.2 oz)    Exam:  Physical Exam: Eyes: +++ icterus, extraocular muscles intact  Mouth: Oral mucosa is moist, no lesions on palate,  Lungs: Normal respiratory effort, bilateral clear to auscultation, no crackles or wheezes.  Heart: Regular rate and rhythm, S1 and S2 normal, no murmurs, rubs  auscultated Abdomen: BS normoactive,soft,nondistended,non-tender to palpation,no organomegaly Extremities: No pretibial edema, no erythema, no cyanosis, no clubbing Neuro : Alert and oriented to time, place and person, No focal deficits  Data Reviewed: Basic Metabolic Panel:  Recent Labs Lab 01/08/14 1645 01/10/14 0150  NA 134* 139  K 3.7 4.4  CL 91* 98  CO2 24 27  GLUCOSE 88 206*  BUN 11 12  CREATININE 0.45* 0.51  CALCIUM 8.8 8.0*   Liver Function Tests:  Recent Labs Lab 01/08/14 1645 01/10/14 0150  AST 126* 104*  ALT 35 32  ALKPHOS 149* 119*  BILITOT 26.5* 23.5*  PROT 6.9 5.9*  ALBUMIN 2.7* 2.3*    Recent Labs Lab 01/08/14 1645  LIPASE 44   No results for input(s): AMMONIA in the last 168 hours. CBC:  Recent Labs Lab 01/08/14 1645 01/08/14 2029 01/10/14 0150  WBC 12.7* 10.4 7.4  NEUTROABS 10.4* 8.3*  --   HGB 12.4* 11.3* 11.4*  HCT 35.3* 32.1* 32.1*  MCV 110.0* 110.3* 112.2*  PLT 228 211 206   HAV antibody- nonreactive  Studies: Ct Abdomen Pelvis W Contrast  01/09/2014   CLINICAL DATA:  Nausea, vomiting, cough, abdominal pain and distention, jaundice, elevated bilirubin, bloody stools, history of ethanol abuse, hypertension, alcoholic hepatitis  EXAM: CT ABDOMEN AND PELVIS WITH CONTRAST  TECHNIQUE: Multidetector CT imaging of the abdomen and pelvis was performed using the standard protocol following bolus administration of intravenous contrast. Sagittal and coronal MPR images reconstructed from axial data set.  CONTRAST:  138m OMNIPAQUE IOHEXOL 300 MG/ML SOLN IV. Dilute oral contrast.  COMPARISON:  None  FINDINGS: Bibasilar atelectasis.  Suboptimal arterial phase, and delayed imaging versus contrast opacification.  Diffuse fatty infiltration of an enlarged liver without focal mass.  Spleen, kidneys, and adrenal glands normal appearance.  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  Additional collaterals are  noted in the anterior abdomen as well.  Small hiatal hernia.  Edema identified at the second and third portions of the duodenum extending into proximal jejunum and small bowel mesentery, associated with mild enlargement and edematous changes of the pancreatic head suggesting focal pancreatitis.  Pancreatic body and tail grossly unremarkable.  Major venous structures patent.  Fluid/thickening of the LEFT anterior pararenal and lateral conal fascia.  Mild gallbladder wall thickening, nonspecific in setting of ascites in abdomen and pelvis.  BILATERAL inguinal hernias containing fat and fluid.  Unable to exclude colonic wall thickening,colon unopacified and under distended.  Remaining small bowel loops normal.  Minimal bladder wall thickening.  No mass, adenopathy, free air, or acute osseous findings.  IMPRESSION: Enlarged liver with diffuse hepatic steatosis and ascites.  No gross focal hepatic mass identified though examination is suboptimal due to lack of arterial phase contrast.  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  Suspected focal pancreatitis involving the pancreatic head with associated edema of the second and third portions of the duodenum and fluid in pararenal space into mesentery ; correlation with serum amylase recommended.  Small hiatal and BILATERAL inguinal hernias.   Electronically Signed   By: Lavonia Dana M.D.   On: 01/09/2014 11:24   Dg Abd Acute W/chest  01/08/2014   CLINICAL DATA:  Ascites and elevated bilirubin.  EXAM: ACUTE ABDOMEN SERIES (ABDOMEN 2 VIEW & CHEST 1 VIEW)  COMPARISON:  None.  FINDINGS: Lungs are clear bilaterally. Heart size is normal. The trachea is midline. No evidence for free air. There is a round density in the left abdomen on the upright abdominal view but this is not clearly seen on the supine images. This density could be within bowel. Few gas-filled loops of small bowel. There is a small amount of gas and stool  in the rectal region.  IMPRESSION: No acute chest findings.  Nonspecific bowel gas pattern.   Electronically Signed   By: Markus Daft M.D.   On: 01/08/2014 21:24    Scheduled Meds: . cefTRIAXone (ROCEPHIN)  IV  1 g Intravenous Q24H  . folic acid  1 mg Oral Daily  . furosemide  40 mg Oral Daily  . LORazepam  0-4 mg Intravenous Q6H   Followed by  . LORazepam  0-4 mg Intravenous Q12H  . methylPREDNISolone (SOLU-MEDROL) injection  40 mg Intravenous Q24H  . multivitamin with minerals  1 tablet Oral Daily  . pantoprazole  40 mg Oral BID  . sodium chloride  3 mL Intravenous Q12H  . spironolactone  100 mg Oral Daily   Continuous Infusions: . octreotide  (SANDOSTATIN)    IV infusion 25 mcg/hr (01/09/14 2303)    Principal Problem:   GI bleed Active Problems:   Ascites   Alcohol abuse   Elevated bilirubin   Nausea & vomiting   UTI (urinary tract infection)    Time spent: 25 minutes.     [facility] Pager [phone]. If 7PM-7AM, please contact night-coverage at [email or web address], password [facility] 01/10/2014, 1:33 PM  LOS: 2 days

## 2014-01-10 NOTE — Plan of Care (Signed)
Problem: Phase II Progression Outcomes Goal: No active bleeding Outcome: Completed/Met Date Met:  01/10/14 Goal: Hemodynamically stable Outcome: Completed/Met Date Met:  01/10/14 Goal: H&H stablized < 1gm drop in 24 hrs Outcome: Completed/Met Date Met:  01/10/14 Goal: Progress activity as tolerated unless otherwise ordered Outcome: Completed/Met Date Met:  01/10/14 Goal: Tolerating diet Outcome: Completed/Met Date Met:  01/10/14

## 2014-01-10 NOTE — Progress Notes (Signed)
CRITICAL VALUE ALERT  Critical value received:  Total Bilirubin 23.5  Date of notification:  01/10/14  Time of notification:  0250  Critical value read back:Yes.    Nurse who received alert:  Star Age   MD notified (1st page):  Santiago Glad NP  Time of first page:  0253  MD notified (2nd page):  Time of second page:  Responding MD:  Paged as an FYI this bilirubin is better. We will continue to monitor patient.   Time MD responded:

## 2014-01-10 NOTE — Progress Notes (Signed)
UNASSIGNED PATIENT Subjective: Patient seems to be doing somewhat better today. He has had no further rectal bleeding or melena. He denies having any abdominal pain, nausea or vomiting. He does not seem to have ANY idea about the extent of his liver disease. CT findings and EGD findings noted.   Objective: Vital signs in last 24 hours: Temp:  [98 F (36.7 C)-99 F (37.2 C)] 98.1 F (36.7 C) (12/02 1426) Pulse Rate:  [84-115] 88 (12/02 1426) Resp:  [18-20] 20 (12/02 1426) BP: (122-142)/(70-83) 132/75 mmHg (12/02 1426) SpO2:  [94 %-98 %] 96 % (12/02 1426) Weight:  [106.232 kg (234 lb 3.2 oz)] 106.232 kg (234 lb 3.2 oz) (12/02 0535) Last BM Date: 01/10/14 (per pt)  Intake/Output from previous day: 12/01 0701 - 12/02 0700 In: 3618 [P.O.:240; I.V.:2860; Blood:468; IV Piggyback:50] Out: 80 [Urine:80] Intake/Output this shift: Total I/O In: 559.4 [P.O.:360; I.V.:199.4] Out: 300 [Urine:300]  General appearance: alert, cooperative, appears stated age and no distress Resp: clear to auscultation bilaterally Cardio: regular rate and rhythm, S1, S2 normal, no murmur, click, rub or gallop GI: soft, non-tender; bowel sounds normal; no masses,  no organomegaly Extremities: extremities normal, atraumatic, no cyanosis or edema  Lab Results:  Recent Labs  01/08/14 2029 01/10/14 0150 01/10/14 1331  WBC 10.4 7.4 11.8*  HGB 11.3* 11.4* 10.9*  HCT 32.1* 32.1* 31.1*  PLT 211 206 210   BMET  Recent Labs  01/08/14 1645 01/10/14 0150  NA 134* 139  K 3.7 4.4  CL 91* 98  CO2 24 27  GLUCOSE 88 206*  BUN 11 12  CREATININE 0.45* 0.51  CALCIUM 8.8 8.0*   LFT  Recent Labs  01/10/14 0150  PROT 5.9*  ALBUMIN 2.3*  AST 104*  ALT 32  ALKPHOS 119*  BILITOT 23.5*  BILIDIR 18.6*  IBILI 4.9*   PT/INR  Recent Labs  01/08/14 2029 01/10/14 0150  LABPROT 23.0* 21.9*  INR 2.01* 1.90*   Hepatitis Panel  Recent Labs  01/08/14 2029  HEPBSAG NEGATIVE  HCVAB NEGATIVE  HEPAIGM NON  REACTIVE  HEPBIGM NON REACTIVE   Studies/Results: Ct Abdomen Pelvis W Contrast  01/09/2014   CLINICAL DATA:  Nausea, vomiting, cough, abdominal pain and distention, jaundice, elevated bilirubin, bloody stools, history of ethanol abuse, hypertension, alcoholic hepatitis  EXAM: CT ABDOMEN AND PELVIS WITH CONTRAST  TECHNIQUE: Multidetector CT imaging of the abdomen and pelvis was performed using the standard protocol following bolus administration of intravenous contrast. Sagittal and coronal MPR images reconstructed from axial data set.  CONTRAST:  175m OMNIPAQUE IOHEXOL 300 MG/ML SOLN IV. Dilute oral contrast.  COMPARISON:  None  FINDINGS: Bibasilar atelectasis.  Suboptimal arterial phase, and delayed imaging versus contrast opacification.  Diffuse fatty infiltration of an enlarged liver without focal mass.  Spleen, kidneys, and adrenal glands normal appearance.  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  Additional collaterals are noted in the anterior abdomen as well.  Small hiatal hernia.  Edema identified at the second and third portions of the duodenum extending into proximal jejunum and small bowel mesentery, associated with mild enlargement and edematous changes of the pancreatic head suggesting focal pancreatitis.  Pancreatic body and tail grossly unremarkable.  Major venous structures patent.  Fluid/thickening of the LEFT anterior pararenal and lateral conal fascia.  Mild gallbladder wall thickening, nonspecific in setting of ascites in abdomen and pelvis.  BILATERAL inguinal hernias containing fat and fluid.  Unable to exclude colonic wall thickening,colon unopacified and under distended.  Remaining small bowel loops normal.  Minimal bladder wall thickening.  No mass, adenopathy, free air, or acute osseous findings.  IMPRESSION: Enlarged liver with diffuse hepatic steatosis and ascites.  No gross focal hepatic mass identified though examination is  suboptimal due to lack of arterial phase contrast.  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  Suspected focal pancreatitis involving the pancreatic head with associated edema of the second and third portions of the duodenum and fluid in pararenal space into mesentery ; correlation with serum amylase recommended.  Small hiatal and BILATERAL inguinal hernias.   Electronically Signed   By: Lavonia Dana M.D.   On: 01/09/2014 11:24   Dg Abd Acute W/chest  01/08/2014   CLINICAL DATA:  Ascites and elevated bilirubin.  EXAM: ACUTE ABDOMEN SERIES (ABDOMEN 2 VIEW & CHEST 1 VIEW)  COMPARISON:  None.  FINDINGS: Lungs are clear bilaterally. Heart size is normal. The trachea is midline. No evidence for free air. There is a round density in the left abdomen on the upright abdominal view but this is not clearly seen on the supine images. This density could be within bowel. Few gas-filled loops of small bowel. There is a small amount of gas and stool in the rectal region.  IMPRESSION: No acute chest findings.  Nonspecific bowel gas pattern.   Electronically Signed   By: Markus Daft M.D.   On: 01/08/2014 21:24   Medications: I have reviewed the patient's current medications.  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   2) Diffuse hepatic steatosis. 3) Coagulopathy second to 1.  4) ?Focal pancreatitis. 5) On CIWA protocol for alcohol withdrawal. 6) UTI.  LOS: 2 days   Drae Mitzel 01/10/2014, 4:04 PM

## 2014-01-10 NOTE — Care Management Note (Signed)
    Page 1 of 1   01/10/2014     10:54:54 AM CARE MANAGEMENT NOTE 01/10/2014  Patient:  Colin Hodges, Colin Hodges   Account Number:  [id number]  Date Initiated:  01/10/2014  Documentation initiated by:  Dessa Phi  Subjective/Objective Assessment:   43 Y/O M ADMITTED W/GIB.EL:TRVU.     Action/Plan:   FROM HOME.   Anticipated DC Date:  01/11/2014   Anticipated DC Plan:  HOME/SELF CARE  In-house referral  PCP / [place]  CM consult      Choice offered to / List presented to:             Status of service:  In process, will continue to follow Medicare Important Message given?   (If response is "NO", the following Medicare IM given date fields will be blank) Date Medicare IM given:   Medicare IM given by:   Date Additional Medicare IM given:   Additional Medicare IM given by:    Discharge Disposition:    Per UR Regulation:  Reviewed for med. necessity/level of care/duration of stay  If discussed at [place] of Stay Meetings, dates discussed:    Comments:  01/10/14 Colin Antrim RN,BSN NCM [phone] SPOKE TO PATIENT ABOUT D/C PLANS.HE HAS [facility] HOME.HE WILL FIND HIS OWN PCP,HAS A PHARMACY,HAS OWN TRANSP HOME.

## 2014-01-11 LAB — CBC
HEMATOCRIT: 31.7 % — AB (ref 39.0–52.0)
Hemoglobin: 11 g/dL — ABNORMAL LOW (ref 13.0–17.0)
MCH: 38.6 pg — ABNORMAL HIGH (ref 26.0–34.0)
MCHC: 34.7 g/dL (ref 30.0–36.0)
MCV: 111.2 fL — AB (ref 78.0–100.0)
Platelets: 209 10*3/uL (ref 150–400)
RBC: 2.85 MIL/uL — AB (ref 4.22–5.81)
RDW: 15.3 % (ref 11.5–15.5)
WBC: 11.6 10*3/uL — ABNORMAL HIGH (ref 4.0–10.5)

## 2014-01-11 LAB — COMPREHENSIVE METABOLIC PANEL
ALBUMIN: 2.2 g/dL — AB (ref 3.5–5.2)
ALK PHOS: 121 U/L — AB (ref 39–117)
ALT: 45 U/L (ref 0–53)
ANION GAP: 12 (ref 5–15)
AST: 113 U/L — ABNORMAL HIGH (ref 0–37)
BILIRUBIN TOTAL: 23.3 mg/dL — AB (ref 0.3–1.2)
BUN: 12 mg/dL (ref 6–23)
CHLORIDE: 95 meq/L — AB (ref 96–112)
CO2: 27 mEq/L (ref 19–32)
Calcium: 8.3 mg/dL — ABNORMAL LOW (ref 8.4–10.5)
Creatinine, Ser: 0.45 mg/dL — ABNORMAL LOW (ref 0.50–1.35)
GFR calc Af Amer: >90 mL/min (ref >90)
Glucose, Bld: 152 mg/dL — ABNORMAL HIGH (ref 70–99)
POTASSIUM: 4.2 meq/L (ref 3.7–5.3)
Sodium: 134 mEq/L — ABNORMAL LOW (ref 137–147)
Total Protein: 5.5 g/dL — ABNORMAL LOW (ref 6.0–8.3)

## 2014-01-11 NOTE — Progress Notes (Signed)
Subjective: Jittery in the evenings.  Overall he is feeling better.  Objective: Vital signs in last 24 hours: Temp:  [98 F (36.7 C)-98.3 F (36.8 C)] 98.1 F (36.7 C) (12/03 1355) Pulse Rate:  [83-89] 89 (12/03 1355) Resp:  [20] 20 (12/03 1355) BP: (124-129)/(75-85) 124/75 mmHg (12/03 1355) SpO2:  [94 %-97 %] 97 % (12/03 1355) Weight:  [106.505 kg (234 lb 12.8 oz)] 106.505 kg (234 lb 12.8 oz) (12/03 0450) Last BM Date: 01/10/14  Intake/Output from previous day: 12/02 0701 - 12/03 0700 In: 956.9 [P.O.:720; I.V.:236.9] Out: 1600 [Urine:1600] Intake/Output this shift: Total I/O In: 240 [P.O.:240] Out: -   General appearance: alert and no distress GI: distended with ascites, but decreased, no umbilical hernia  Lab Results:  Recent Labs  01/10/14 0150 01/10/14 1331 01/11/14 0438  WBC 7.4 11.8* 11.6*  HGB 11.4* 10.9* 11.0*  HCT 32.1* 31.1* 31.7*  PLT 206 210 209   BMET  Recent Labs  01/08/14 1645 01/10/14 0150 01/11/14 0438  NA 134* 139 134*  K 3.7 4.4 4.2  CL 91* 98 95*  CO2 24 27 27   GLUCOSE 88 206* 152*  BUN 11 12 12   CREATININE 0.45* 0.51 0.45*  CALCIUM 8.8 8.0* 8.3*   LFT  Recent Labs  01/10/14 0150 01/11/14 0438  PROT 5.9* 5.5*  ALBUMIN 2.3* 2.2*  AST 104* 113*  ALT 32 45  ALKPHOS 119* 121*  BILITOT 23.5* 23.3*  BILIDIR 18.6*  --   IBILI 4.9*  --    PT/INR  Recent Labs  01/08/14 2029 01/10/14 0150  LABPROT 23.0* 21.9*  INR 2.01* 1.90*   Hepatitis Panel  Recent Labs  01/08/14 2029  HEPBSAG NEGATIVE  HCVAB NEGATIVE  HEPAIGM NON REACTIVE  HEPBIGM NON REACTIVE   C-Diff No results for input(s): CDIFFTOX in the last 72 hours. Fecal Lactopherrin No results for input(s): FECLLACTOFRN in the last 72 hours.  Studies/Results: No results found.  Medications:  Scheduled: . cefTRIAXone (ROCEPHIN)  IV  1 g Intravenous Q24H  . folic acid  1 mg Oral Daily  . furosemide  40 mg Oral Daily  . LORazepam  0-4 mg Intravenous Q12H  .  methylPREDNISolone (SOLU-MEDROL) injection  40 mg Intravenous Q24H  . multivitamin with minerals  1 tablet Oral Daily  . pantoprazole  40 mg Oral BID  . sodium chloride  3 mL Intravenous Q12H  . spironolactone  100 mg Oral Daily   Continuous: . octreotide  (SANDOSTATIN)    IV infusion 25 mcg/hr (01/10/14 1834)    Assessment/Plan: 1) ETOH hepatitis. 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 3) Fundic varix.   Currently he is stable.  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   No overt signs of ETOH withdrawal.  I would like to see a downward trend with his TB.  It is a poor prognostic indicator if his TB remains elevated.  At Day 7 a [place] score can be calculated to determine the probability of survival in 6 months.  Plan: 1) Continue with nutrition. 2) Continue with Solumedrol. 3) Continue with diuretics.  LOS: 3 days   Dalten Ambrosino D 01/11/2014, 3:27 PM

## 2014-01-11 NOTE — Plan of Care (Signed)
Problem: Phase I Progression Outcomes Goal: Pain controlled with appropriate interventions Outcome: Completed/Met Date Met:  01/11/14 Goal: Other Phase I Outcomes/Goals Outcome: Not Applicable Date Met:  44/96/75

## 2014-01-11 NOTE — Progress Notes (Signed)
[facility] PROGRESS NOTE  Rakwon Letourneau WUJ:[id number] DOB: 1970-05-17 DOA: 01/08/2014 PCP: No primary care provider on file.  Assessment/Plan:  Melena; coffee ground Emesis;  Continue with octreotide, Protonix and ceftriaxone for SBP prophylaxis.  Endoscopy showed no esophageal varices, 3-4 cm hiatal hernia, portal hypertension gastropathy, gross evidence of long segment Barrett's esophagus. No more coffee-ground emesis. Will continue to monitor hemoglobin  Alcoholic Hepatitis; Hyperbilirubinemia, ascites.  GI consulted. Started on steroids.  Follow LFT.  CT abdomen showed: portal venous HTN, enlarged liver with diffuse hepatic steatosis. Focal pancreatitis.  Started on spironolactone.  Hepatitis panel negative.  Bilirubin is still elevated to 23.3 GI following  Alcohol use;  Continue with CIWA protocol.  No signs or symptoms of alcohol withdrawal  UTI; Continue with ceftriaxone.  Urine culture is pending     Code Status: Full Code.  Family Communication: Care discussed with patient Disposition Plan: remain inpatient.     Consultants:  GI  Procedures:  Endoscopy  Antibiotics:  Ceftriaxone.   HPI/Subjective: Patient denies nausea and vomiting. Eating breakfast.   Objective: Filed Vitals:   01/11/14 1355  BP: 124/75  Pulse: 89  Temp: 98.1 F (36.7 C)  Resp: 20    Intake/Output Summary (Last 24 hours) at 01/11/14 1509 Last data filed at 01/11/14 0825  Gross per 24 hour  Intake  637.5 ml  Output   1300 ml  Net -662.5 ml   Filed Weights   01/09/14 0439 01/10/14 0535 01/11/14 0450  Weight: 103.7 kg (228 lb 9.9 oz) 106.232 kg (234 lb 3.2 oz) 106.505 kg (234 lb 12.8 oz)    Exam:  Physical Exam: Eyes: +++ icterus, extraocular muscles intact  Mouth: Oral mucosa is moist, no lesions on palate,  Lungs: Normal respiratory effort, bilateral clear to auscultation, no crackles or wheezes.  Heart: Regular rate and rhythm, S1 and S2 normal, no  murmurs, rubs auscultated Abdomen: BS normoactive,soft,nondistended,non-tender to palpation,no organomegaly Extremities: No pretibial edema, no erythema, no cyanosis, no clubbing Neuro : Alert and oriented to time, place and person, No focal deficits  Data Reviewed: Basic Metabolic Panel:  Recent Labs Lab 01/08/14 1645 01/10/14 0150 01/11/14 0438  NA 134* 139 134*  K 3.7 4.4 4.2  CL 91* 98 95*  CO2 24 27 27   GLUCOSE 88 206* 152*  BUN 11 12 12   CREATININE 0.45* 0.51 0.45*  CALCIUM 8.8 8.0* 8.3*   Liver Function Tests:  Recent Labs Lab 01/08/14 1645 01/10/14 0150 01/11/14 0438  AST 126* 104* 113*  ALT 35 32 45  ALKPHOS 149* 119* 121*  BILITOT 26.5* 23.5* 23.3*  PROT 6.9 5.9* 5.5*  ALBUMIN 2.7* 2.3* 2.2*    Recent Labs Lab 01/08/14 1645  LIPASE 44   No results for input(s): AMMONIA in the last 168 hours. CBC:  Recent Labs Lab 01/08/14 1645 01/08/14 2029 01/10/14 0150 01/10/14 1331 01/11/14 0438  WBC 12.7* 10.4 7.4 11.8* 11.6*  NEUTROABS 10.4* 8.3*  --   --   --   HGB 12.4* 11.3* 11.4* 10.9* 11.0*  HCT 35.3* 32.1* 32.1* 31.1* 31.7*  MCV 110.0* 110.3* 112.2* 110.7* 111.2*  PLT 228 211 206 210 209   HAV antibody- nonreactive  Studies: No results found.  Scheduled Meds: . cefTRIAXone (ROCEPHIN)  IV  1 g Intravenous Q24H  . folic acid  1 mg Oral Daily  . furosemide  40 mg Oral Daily  . LORazepam  0-4 mg Intravenous Q12H  . methylPREDNISolone (SOLU-MEDROL) injection  40 mg Intravenous Q24H  .  multivitamin with minerals  1 tablet Oral Daily  . pantoprazole  40 mg Oral BID  . sodium chloride  3 mL Intravenous Q12H  . spironolactone  100 mg Oral Daily   Continuous Infusions: . octreotide  (SANDOSTATIN)    IV infusion 25 mcg/hr (01/10/14 1834)    Principal Problem:   GI bleed Active Problems:   Ascites   Alcohol abuse   Elevated bilirubin   Nausea & vomiting   UTI (urinary tract infection)    Time spent: 25 minutes.     [facility] Pager [phone]. If 7PM-7AM, please contact night-coverage at [email or web address], password [facility] 01/11/2014, 3:09 PM  LOS: 3 days

## 2014-01-12 LAB — COMPREHENSIVE METABOLIC PANEL
ALT: 65 U/L — ABNORMAL HIGH (ref 0–53)
AST: 141 U/L — ABNORMAL HIGH (ref 0–37)
Albumin: 2.2 g/dL — ABNORMAL LOW (ref 3.5–5.2)
Alkaline Phosphatase: 130 U/L — ABNORMAL HIGH (ref 39–117)
Anion gap: 12 (ref 5–15)
BUN: 12 mg/dL (ref 6–23)
CALCIUM: 8.5 mg/dL (ref 8.4–10.5)
CO2: 26 meq/L (ref 19–32)
CREATININE: 0.5 mg/dL (ref 0.50–1.35)
Chloride: 95 mEq/L — ABNORMAL LOW (ref 96–112)
GLUCOSE: 145 mg/dL — AB (ref 70–99)
Potassium: 4.5 mEq/L (ref 3.7–5.3)
Sodium: 133 mEq/L — ABNORMAL LOW (ref 137–147)
TOTAL PROTEIN: 5.9 g/dL — AB (ref 6.0–8.3)
Total Bilirubin: 25.7 mg/dL (ref 0.3–1.2)

## 2014-01-12 MED ORDER — LORAZEPAM 2 MG/ML IJ SOLN
INTRAMUSCULAR | Status: AC
  Filled 2014-01-12: qty 1

## 2014-01-12 MED ORDER — HYDROCODONE-ACETAMINOPHEN 5-325 MG PO TABS
1.0000 | ORAL_TABLET | Freq: Once | ORAL | Status: AC
  Administered 2014-01-12: 1 via ORAL

## 2014-01-12 MED ORDER — LORAZEPAM 2 MG/ML IJ SOLN
0.0000 mg | Freq: Two times a day (BID) | INTRAMUSCULAR | Status: AC
  Administered 2014-01-13: 1 mg via INTRAVENOUS
  Administered 2014-01-13 (×2): 2 mg via INTRAVENOUS
  Administered 2014-01-14: 1 mg via INTRAVENOUS
  Filled 2014-01-12 (×2): qty 1

## 2014-01-12 MED ORDER — HYDROCODONE-ACETAMINOPHEN 5-325 MG PO TABS
ORAL_TABLET | ORAL | Status: AC
  Filled 2014-01-12: qty 1

## 2014-01-12 NOTE — Plan of Care (Signed)
Problem: Phase I Progression Outcomes Goal: Initial discharge plan identified Outcome: Progressing Goal: Hemodynamically stable Outcome: Completed/Met Date Met:  01/12/14  Problem: Phase II Progression Outcomes Goal: Other Phase II Outcomes/Goals Outcome: Not Applicable Date Met:  41/66/06

## 2014-01-12 NOTE — Plan of Care (Signed)
Problem: Phase I Progression Outcomes Goal: Initial discharge plan identified Outcome: Progressing  Problem: Phase III Progression Outcomes Goal: Activity at appropriate level-compared to baseline (UP IN CHAIR FOR HEMODIALYSIS)  Outcome: Progressing Goal: Discharge plan remains appropriate-arrangements made Outcome: Progressing Goal: H&H stablized <1gm drop in 24 hrs, no active bleeding Outcome: Progressing

## 2014-01-12 NOTE — Progress Notes (Signed)
Subjective: Feeling well.  No complaints.  Objective: Vital signs in last 24 hours: Temp:  [98.1 F (36.7 C)-98.6 F (37 C)] 98.1 F (36.7 C) (12/04 1339) Pulse Rate:  [81-87] 87 (12/04 1339) Resp:  [20] 20 (12/04 1339) BP: (122-148)/(83-88) 125/83 mmHg (12/04 1339) SpO2:  [94 %-98 %] 98 % (12/04 1339) Weight:  [103.874 kg (229 lb)] 103.874 kg (229 lb) (12/04 0506) Last BM Date: 01/12/14  Intake/Output from previous day: 12/03 0701 - 12/04 0700 In: 730 [P.O.:480; I.V.:150; IV Piggyback:100] Out: 1075 [Urine:1075] Intake/Output this shift: Total I/O In: 480 [P.O.:480] Out: 640 [Urine:640]  General appearance: alert and no distress GI: distended with ascites, decreasing, no pain  Lab Results:  Recent Labs  01/10/14 0150 01/10/14 1331 01/11/14 0438  WBC 7.4 11.8* 11.6*  HGB 11.4* 10.9* 11.0*  HCT 32.1* 31.1* 31.7*  PLT 206 210 209   BMET  Recent Labs  01/10/14 0150 01/11/14 0438 01/12/14 0355  NA 139 134* 133*  K 4.4 4.2 4.5  CL 98 95* 95*  CO2 27 27 26   GLUCOSE 206* 152* 145*  BUN 12 12 12   CREATININE 0.51 0.45* 0.50  CALCIUM 8.0* 8.3* 8.5   LFT  Recent Labs  01/10/14 0150  01/12/14 0355  PROT 5.9*  < > 5.9*  ALBUMIN 2.3*  < > 2.2*  AST 104*  < > 141*  ALT 32  < > 65*  ALKPHOS 119*  < > 130*  BILITOT 23.5*  < > 25.7*  BILIDIR 18.6*  --   --   IBILI 4.9*  --   --   < > = values in this interval not displayed. PT/INR  Recent Labs  01/10/14 0150  LABPROT 21.9*  INR 1.90*   Hepatitis Panel No results for input(s): HEPBSAG, HCVAB, HEPAIGM, HEPBIGM in the last 72 hours. C-Diff No results for input(s): CDIFFTOX in the last 72 hours. Fecal Lactopherrin No results for input(s): FECLLACTOFRN in the last 72 hours.  Studies/Results: No results found.  Medications:  Scheduled: . cefTRIAXone (ROCEPHIN)  IV  1 g Intravenous Q24H  . folic acid  1 mg Oral Daily  . furosemide  40 mg Oral Daily  . methylPREDNISolone (SOLU-MEDROL) injection  40  mg Intravenous Q24H  . multivitamin with minerals  1 tablet Oral Daily  . pantoprazole  40 mg Oral BID  . sodium chloride  3 mL Intravenous Q12H  . spironolactone  100 mg Oral Daily   Continuous: . octreotide  (SANDOSTATIN)    IV infusion 25 mcg/hr (01/11/14 1955)    Assessment/Plan: 1) ETOH hepatitis. 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 3) Fundic varix.  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  The last INR was on 12/2 and it was at 1.9.  Clinically he is well, but I am concerned with the increase in TB.  Marland Kitchen  Plan: 1) Continue with nutrition. 2) Continue with Solumedrol. 3) Continue with diuretics. 4) Check INR in the AM. 5) After tomorrow, octreotide can be discontinued. 6) Treat with ceftriaxone for approximately 7 days. 7) If TB is stable or it declines, he can be discharged home with quick follow up in the office.  This is provided that his INR has not increased.  LOS: 4 days   Ellinore Merced D 01/12/2014, 2:06 PM

## 2014-01-12 NOTE — Progress Notes (Signed)
CRITICAL VALUE ALERT  Critical value received:  Total bilirubin  Date of notification:  01/12/2014   Time of notification:  0500  Critical value read back:Yes.    Nurse who received alert:  Carnella Guadalajara I  MD notified (1st page):  n/a  Time of first page:  n/a  MD aware of liver enzymes already

## 2014-01-12 NOTE — Progress Notes (Signed)
[facility] PROGRESS NOTE  Colin Hodges ZJQ:[id number] DOB: 1970/12/13 DOA: 01/08/2014 PCP: No primary care provider on file.  Assessment/Plan:  Melena; coffee ground Emesis;  Continue with octreotide, Protonix and ceftriaxone for SBP prophylaxis.  Endoscopy showed no esophageal varices, 3-4 cm hiatal hernia, portal hypertension gastropathy, gross evidence of long segment Barrett's esophagus. No more coffee-ground emesis. Will continue to monitor hemoglobin  Alcoholic Hepatitis; Hyperbilirubinemia, ascites.  GI consulted. Started on steroids.  Follow LFT.  CT abdomen showed: portal venous HTN, enlarged liver with diffuse hepatic steatosis. Focal pancreatitis.  Started on spironolactone.  Hepatitis panel negative.  Bilirubin is still elevated to 25 GI following  Alcohol use;  Continue with CIWA protocol.  No signs or symptoms of alcohol withdrawal  ? UTI; Was started on Ceftriaxone. Urine culture showed no growth     Code Status: Full Code.  Family Communication: Care discussed with patient Disposition Plan: remain inpatient.     Consultants:  GI  Procedures:  Endoscopy  Antibiotics:  Ceftriaxone.   HPI/Subjective: Patient denies nausea and vomiting. Feels better.   Objective: Filed Vitals:   01/12/14 0506  BP: 122/86  Pulse: 81  Temp: 98.2 F (36.8 C)  Resp: 20    Intake/Output Summary (Last 24 hours) at 01/12/14 1231 Last data filed at 01/12/14 0940  Gross per 24 hour  Intake    730 ml  Output   1315 ml  Net   -585 ml   Filed Weights   01/10/14 0535 01/11/14 0450 01/12/14 0506  Weight: 106.232 kg (234 lb 3.2 oz) 106.505 kg (234 lb 12.8 oz) 103.874 kg (229 lb)    Exam:  Physical Exam: Eyes: +++ icterus, extraocular muscles intact  Mouth: Oral mucosa is moist, no lesions on palate,  Lungs: Normal respiratory effort, bilateral clear to auscultation, no crackles or wheezes.  Heart: Regular rate and rhythm, S1 and S2 normal, no  murmurs, rubs auscultated Abdomen: BS normoactive,soft,nondistended,non-tender to palpation,no organomegaly Extremities: No pretibial edema, no erythema, no cyanosis, no clubbing Neuro : Alert and oriented to time, place and person, No focal deficits  Data Reviewed: Basic Metabolic Panel:  Recent Labs Lab 01/08/14 1645 01/10/14 0150 01/11/14 0438 01/12/14 0355  NA 134* 139 134* 133*  K 3.7 4.4 4.2 4.5  CL 91* 98 95* 95*  CO2 24 27 27 26   GLUCOSE 88 206* 152* 145*  BUN 11 12 12 12   CREATININE 0.45* 0.51 0.45* 0.50  CALCIUM 8.8 8.0* 8.3* 8.5   Liver Function Tests:  Recent Labs Lab 01/08/14 1645 01/10/14 0150 01/11/14 0438 01/12/14 0355  AST 126* 104* 113* 141*  ALT 35 32 45 65*  ALKPHOS 149* 119* 121* 130*  BILITOT 26.5* 23.5* 23.3* 25.7*  PROT 6.9 5.9* 5.5* 5.9*  ALBUMIN 2.7* 2.3* 2.2* 2.2*    Recent Labs Lab 01/08/14 1645  LIPASE 44   No results for input(s): AMMONIA in the last 168 hours. CBC:  Recent Labs Lab 01/08/14 1645 01/08/14 2029 01/10/14 0150 01/10/14 1331 01/11/14 0438  WBC 12.7* 10.4 7.4 11.8* 11.6*  NEUTROABS 10.4* 8.3*  --   --   --   HGB 12.4* 11.3* 11.4* 10.9* 11.0*  HCT 35.3* 32.1* 32.1* 31.1* 31.7*  MCV 110.0* 110.3* 112.2* 110.7* 111.2*  PLT 228 211 206 210 209   HAV antibody- nonreactive  Studies: No results found.  Scheduled Meds: . cefTRIAXone (ROCEPHIN)  IV  1 g Intravenous Q24H  . folic acid  1 mg Oral Daily  . furosemide  40  mg Oral Daily  . methylPREDNISolone (SOLU-MEDROL) injection  40 mg Intravenous Q24H  . multivitamin with minerals  1 tablet Oral Daily  . pantoprazole  40 mg Oral BID  . sodium chloride  3 mL Intravenous Q12H  . spironolactone  100 mg Oral Daily   Continuous Infusions: . octreotide  (SANDOSTATIN)    IV infusion 25 mcg/hr (01/11/14 1955)    Principal Problem:   GI bleed Active Problems:   Ascites   Alcohol abuse   Elevated bilirubin   Nausea & vomiting   UTI (urinary tract  infection)    Time spent: 25 minutes.     [place] [facility] Pager [phone]. If 7PM-7AM, please contact night-coverage at [email or web address], password [facility] 01/12/2014, 12:31 PM  LOS: 4 days

## 2014-01-13 DIAGNOSIS — R945 Abnormal results of liver function studies: Secondary | ICD-10-CM | POA: Insufficient documentation

## 2014-01-13 DIAGNOSIS — R7989 Other specified abnormal findings of blood chemistry: Secondary | ICD-10-CM | POA: Insufficient documentation

## 2014-01-13 DIAGNOSIS — K7011 Alcoholic hepatitis with ascites: Secondary | ICD-10-CM | POA: Insufficient documentation

## 2014-01-13 LAB — COMPREHENSIVE METABOLIC PANEL
ALBUMIN: 2.2 g/dL — AB (ref 3.5–5.2)
ALT: 90 U/L — ABNORMAL HIGH (ref 0–53)
AST: 159 U/L — AB (ref 0–37)
Alkaline Phosphatase: 128 U/L — ABNORMAL HIGH (ref 39–117)
Anion gap: 11 (ref 5–15)
BUN: 16 mg/dL (ref 6–23)
CALCIUM: 8.6 mg/dL (ref 8.4–10.5)
CO2: 26 mEq/L (ref 19–32)
CREATININE: 0.64 mg/dL (ref 0.50–1.35)
Chloride: 91 mEq/L — ABNORMAL LOW (ref 96–112)
GFR calc Af Amer: >90 mL/min (ref >90)
GFR calc non Af Amer: >90 mL/min (ref >90)
Glucose, Bld: 113 mg/dL — ABNORMAL HIGH (ref 70–99)
Potassium: 4.7 mEq/L (ref 3.7–5.3)
SODIUM: 128 meq/L — AB (ref 137–147)
Total Bilirubin: 25.3 mg/dL (ref 0.3–1.2)
Total Protein: 6 g/dL (ref 6.0–8.3)

## 2014-01-13 LAB — PROTIME-INR
INR: 1.83 — AB (ref 0.00–1.49)
PROTHROMBIN TIME: 21.3 s — AB (ref 11.6–15.2)

## 2014-01-13 MED ORDER — PHYTONADIONE 5 MG PO TABS
5.0000 mg | ORAL_TABLET | Freq: Every day | ORAL | Status: DC
  Administered 2014-01-13 – 2014-01-14 (×2): 5 mg via ORAL
  Filled 2014-01-13 (×2): qty 1

## 2014-01-13 MED ORDER — LORAZEPAM 2 MG/ML IJ SOLN
INTRAMUSCULAR | Status: AC
  Filled 2014-01-13: qty 1

## 2014-01-13 NOTE — Plan of Care (Signed)
Problem: Phase I Progression Outcomes Goal: Initial discharge plan identified Outcome: Completed/Met Date Met:  01/13/14  Problem: Phase III Progression Outcomes Goal: H&H stablized <1gm drop in 24 hrs, no active bleeding Outcome: Completed/Met Date Met:  01/13/14  Problem: Discharge Progression Outcomes Goal: Tolerating diet Outcome: Completed/Met Date Met:  01/13/14

## 2014-01-13 NOTE — Progress Notes (Signed)
Covering for Dr. Benson Norway        Daily Rounding Note  01/13/2014, 12:19 PM  LOS: 5 days   SUBJECTIVE:       Pale stools.  Dark urine.  No nausea, eating very well. Abdominal swelling improved.   OBJECTIVE:         Vital signs in last 24 hours:    Temp:  [97.8 F (36.6 C)-98.2 F (36.8 C)] 97.8 F (36.6 C) (12/05 0444) Pulse Rate:  [67-87] 72 (12/05 0444) Resp:  [18-20] 18 (12/05 0444) BP: (117-132)/(63-83) 117/63 mmHg (12/05 0444) SpO2:  [98 %-100 %] 100 % (12/05 0444) Weight:  [221 lb 6.4 oz (100.426 kg)] 221 lb 6.4 oz (100.426 kg) (12/05 0444) Last BM Date: 01/13/14 Filed Weights   01/11/14 0450 01/12/14 0506 01/13/14 0444  Weight: 234 lb 12.8 oz (106.505 kg) 229 lb (103.874 kg) 221 lb 6.4 oz (100.426 kg)   General: jaundiced, not toxic looking   Heart: RRR Chest: clear bil.  No cough or labored breathing Abdomen: soft, mildly distended.  Active BS.  Not tender.  Extremities: some no pitting pedal/LE edema Neuro/Psych:  Oriented x 3.  Affect subdued.  Oriented x 3.   No asterixis.    Intake/Output from previous day: 12/04 0701 - 12/05 0700 In: 1347.5 [P.O.:960; I.V.:337.5; IV Piggyback:50] Out: 2640 [Urine:2640]  Intake/Output this shift: Total I/O In: 240 [P.O.:240] Out: -   Lab Results:  Recent Labs  01/10/14 1331 01/11/14 0438  WBC 11.8* 11.6*  HGB 10.9* 11.0*  HCT 31.1* 31.7*  PLT 210 209   BMET  Recent Labs  01/11/14 0438 01/12/14 0355 01/13/14 0754  NA 134* 133* 128*  K 4.2 4.5 4.7  CL 95* 95* 91*  CO2 27 26 26   GLUCOSE 152* 145* 113*  BUN 12 12 16   CREATININE 0.45* 0.50 0.64  CALCIUM 8.3* 8.5 8.6   LFT  Recent Labs  01/11/14 0438 01/12/14 0355 01/13/14 0754  PROT 5.5* 5.9* 6.0  ALBUMIN 2.2* 2.2* 2.2*  AST 113* 141* 159*  ALT 45 65* 90*  ALKPHOS 121* 130* 128*  BILITOT 23.3* 25.7* 25.3*   PT/INR  Recent Labs  01/13/14 0425   LABPROT 21.3*  INR 1.83*   Hepatitis Panel No results for input(s): HEPBSAG, HCVAB, HEPAIGM, HEPBIGM in the last 72 hours.  Studies/Results: No results found.   Scheduled Meds: . cefTRIAXone (ROCEPHIN)  IV  1 g Intravenous Q24H  . folic acid  1 mg Oral Daily  . furosemide  40 mg Oral Daily  . LORazepam  0-4 mg Intravenous Q12H  . methylPREDNISolone (SOLU-MEDROL) injection  40 mg Intravenous Q24H  . multivitamin with minerals  1 tablet Oral Daily  . pantoprazole  40 mg Oral BID  . sodium chloride  3 mL Intravenous Q12H  . spironolactone  100 mg Oral Daily   Continuous Infusions: . octreotide  (SANDOSTATIN)    IV infusion 25 mcg/hr (01/12/14 1428)   PRN Meds:.ondansetron **OR** ondansetron (ZOFRAN) IV   ASSESMENT:   *  Alcoholic hepatitis.  LFTs static.  Day 4 Solumedrol.  Acute hepatitis panel is negative. HIV non-reactive.   *  EGD 12/1:  Barretts esophagus. HH. Portal hypertensive gastropathy  with oozing/friablility. 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  Octreotide drip started 12/1 at 0130. On po BID Protonix. Day 6 Rocephin.    *  ? Focal HOP pancreatitis on CT 01/09/14. Lipase 44 at arrival.   *  Ascites. On lasix/aldactone.   *  Folate deficient.  On oral supplement.   *  Coagulopathy.  Slowly improving.    PLAN   *  LFTs in AM.  Would like there to be a downward trend or at least stable before discharge home.   *  Give po Vitamin K.    Azucena Freed  01/13/2014, 12:19 PM Pager: [phone]    Attending physician's note   I have taken an interval history, reviewed the chart and examined the patient. I agree with the Advanced Practitioner's note, impression and recommendations.   Pricilla Riffle. Fuller Plan, MD [facility]

## 2014-01-13 NOTE — Progress Notes (Signed)
[facility] PROGRESS NOTE  Colin Hodges HFW:[id number] DOB: [birth year] DOA: 01/08/2014 PCP: No primary care provider on file.  Assessment/Plan:  Melena; coffee ground Emesis;  Continue with octreotide, Protonix and ceftriaxone for SBP prophylaxis. Will give one more dose in am to complete 7 days. Endoscopy showed no esophageal varices, 3-4 cm hiatal hernia, portal hypertension gastropathy, gross evidence of long segment Barrett's esophagus. No more coffee-ground emesis. Will continue to monitor hemoglobin  Alcoholic Hepatitis; Hyperbilirubinemia, ascites.  GI consulted. Started on steroids.  Follow LFT.  CT abdomen showed: portal venous HTN, enlarged liver with diffuse hepatic steatosis. Focal pancreatitis.  Started on spironolactone.  Hepatitis panel negative.  Bilirubin is still elevated to 25.3 If bili improves or remain stable. Home in am.  Alcohol use;  Continue with CIWA protocol.  No signs or symptoms of alcohol withdrawal  ? UTI; Was started on Ceftriaxone. Urine culture showed no growth   Code Status: Full Code.  Family Communication: Care discussed with patient Disposition Plan: remain inpatient.     Consultants:  GI  Procedures:  Endoscopy  Antibiotics:  Ceftriaxone.   HPI/Subjective: Patient denies nausea and vomiting. LFT's are going up. Total bili is still 25.3   Objective: Filed Vitals:   01/13/14 0444  BP: 117/63  Pulse: 72  Temp: 97.8 F (36.6 C)  Resp: 18    Intake/Output Summary (Last 24 hours) at 01/13/14 0902 Last data filed at 01/13/14 0600  Gross per 24 hour  Intake 1107.5 ml  Output   2640 ml  Net -1532.5 ml   Filed Weights   01/11/14 0450 01/12/14 0506 01/13/14 0444  Weight: 106.505 kg (234 lb 12.8 oz) 103.874 kg (229 lb) 100.426 kg (221 lb 6.4 oz)    Exam:  Physical Exam: Eyes: +++ icterus, extraocular muscles intact  Mouth: Oral mucosa is moist, no lesions on palate,  Lungs: Normal respiratory effort,  bilateral clear to auscultation, no crackles or wheezes.  Heart: Regular rate and rhythm, S1 and S2 normal, no murmurs, rubs auscultated Abdomen: BS normoactive,soft,nondistended,non-tender to palpation,no organomegaly Extremities: No pretibial edema, no erythema, no cyanosis, no clubbing Neuro : Alert and oriented to time, place and person, No focal deficits  Data Reviewed: Basic Metabolic Panel:  Recent Labs Lab 01/08/14 1645 01/10/14 0150 01/11/14 0438 01/12/14 0355 01/13/14 0754  NA 134* 139 134* 133* 128*  K 3.7 4.4 4.2 4.5 4.7  CL 91* 98 95* 95* 91*  CO2 24 27 27 26 26   GLUCOSE 88 206* 152* 145* 113*  BUN 11 12 12 12 16   CREATININE 0.45* 0.51 0.45* 0.50 0.64  CALCIUM 8.8 8.0* 8.3* 8.5 8.6   Liver Function Tests:  Recent Labs Lab 01/08/14 1645 01/10/14 0150 01/11/14 0438 01/12/14 0355 01/13/14 0754  AST 126* 104* 113* 141* 159*  ALT 35 32 45 65* 90*  ALKPHOS 149* 119* 121* 130* 128*  BILITOT 26.5* 23.5* 23.3* 25.7* 25.3*  PROT 6.9 5.9* 5.5* 5.9* 6.0  ALBUMIN 2.7* 2.3* 2.2* 2.2* 2.2*    Recent Labs Lab 01/08/14 1645  LIPASE 44   No results for input(s): AMMONIA in the last 168 hours. CBC:  Recent Labs Lab 01/08/14 1645 01/08/14 2029 01/10/14 0150 01/10/14 1331 01/11/14 0438  WBC 12.7* 10.4 7.4 11.8* 11.6*  NEUTROABS 10.4* 8.3*  --   --   --   HGB 12.4* 11.3* 11.4* 10.9* 11.0*  HCT 35.3* 32.1* 32.1* 31.1* 31.7*  MCV 110.0* 110.3* 112.2* 110.7* 111.2*  PLT 228 211 206 210 209  HAV antibody- nonreactive  Studies: No results found.  Scheduled Meds: . cefTRIAXone (ROCEPHIN)  IV  1 g Intravenous Q24H  . folic acid  1 mg Oral Daily  . furosemide  40 mg Oral Daily  . LORazepam  0-4 mg Intravenous Q12H  . methylPREDNISolone (SOLU-MEDROL) injection  40 mg Intravenous Q24H  . multivitamin with minerals  1 tablet Oral Daily  . pantoprazole  40 mg Oral BID  . sodium chloride  3 mL Intravenous Q12H  . spironolactone  100 mg Oral Daily    Continuous Infusions: . octreotide  (SANDOSTATIN)    IV infusion 25 mcg/hr (01/12/14 1428)    Principal Problem:   GI bleed Active Problems:   Ascites   Alcohol abuse   Elevated bilirubin   Nausea & vomiting   UTI (urinary tract infection)    Time spent: 25 minutes.     [facility] Pager [phone]. If 7PM-7AM, please contact night-coverage at [email or web address], password [facility] 01/13/2014, 9:02 AM  LOS: 5 days

## 2014-01-13 NOTE — Plan of Care (Signed)
Problem: Phase III Progression Outcomes Goal: Activity at appropriate level-compared to baseline (UP IN CHAIR FOR HEMODIALYSIS)  Outcome: Completed/Met Date Met:  01/13/14

## 2014-01-14 LAB — COMPREHENSIVE METABOLIC PANEL
ALT: 102 U/L — AB (ref 0–53)
AST: 158 U/L — ABNORMAL HIGH (ref 0–37)
Albumin: 2.1 g/dL — ABNORMAL LOW (ref 3.5–5.2)
Alkaline Phosphatase: 126 U/L — ABNORMAL HIGH (ref 39–117)
Anion gap: 12 (ref 5–15)
BILIRUBIN TOTAL: 23.4 mg/dL — AB (ref 0.3–1.2)
BUN: 15 mg/dL (ref 6–23)
CO2: 27 meq/L (ref 19–32)
Calcium: 8.4 mg/dL (ref 8.4–10.5)
Chloride: 93 mEq/L — ABNORMAL LOW (ref 96–112)
Creatinine, Ser: 0.59 mg/dL (ref 0.50–1.35)
GLUCOSE: 129 mg/dL — AB (ref 70–99)
POTASSIUM: 4.2 meq/L (ref 3.7–5.3)
Sodium: 132 mEq/L — ABNORMAL LOW (ref 137–147)
TOTAL PROTEIN: 5.7 g/dL — AB (ref 6.0–8.3)

## 2014-01-14 MED ORDER — FOLIC ACID 1 MG PO TABS: 1.0000 mg | ORAL_TABLET | Freq: Every day | ORAL | Status: DC

## 2014-01-14 MED ORDER — LORAZEPAM 0.5 MG PO TABS: 0.5000 mg | ORAL_TABLET | Freq: Every day | ORAL | Status: DC | PRN

## 2014-01-14 MED ORDER — FUROSEMIDE 40 MG PO TABS: 40.0000 mg | ORAL_TABLET | Freq: Every day | ORAL | Status: DC

## 2014-01-14 MED ORDER — PHYTONADIONE 5 MG PO TABS: 5.0000 mg | ORAL_TABLET | Freq: Every day | ORAL | Status: DC

## 2014-01-14 MED ORDER — PREDNISONE 50 MG PO TABS: 50.0000 mg | ORAL_TABLET | Freq: Every day | ORAL | Status: DC

## 2014-01-14 MED ORDER — SPIRONOLACTONE 100 MG PO TABS: 100.0000 mg | ORAL_TABLET | Freq: Every day | ORAL | Status: DC

## 2014-01-14 MED ORDER — PANTOPRAZOLE SODIUM 40 MG PO TBEC: 40.0000 mg | DELAYED_RELEASE_TABLET | Freq: Two times a day (BID) | ORAL | Status: DC

## 2014-01-14 MED ORDER — ONDANSETRON HCL 4 MG PO TABS: 4.0000 mg | ORAL_TABLET | Freq: Four times a day (QID) | ORAL | Status: DC | PRN

## 2014-01-14 NOTE — Discharge Summary (Signed)
Physician Discharge Summary  Colin Hodges AUQ:[id number] DOB: 06-08-1970 DOA: 01/08/2014  PCP: No primary care provider on file.  Admit date: 01/08/2014 Discharge date: 01/14/2014  Time spent: 50* minutes  Recommendations for Outpatient Follow-up:  1. *Follow up Dr Benson Norway in one week  Discharge Diagnoses:  Principal Problem:   GI bleed Active Problems:   Ascites   Alcohol abuse   Elevated bilirubin   Nausea & vomiting   UTI (urinary tract infection)   Abnormal LFTs   Alcoholic hepatitis with ascites   Discharge Condition: Stable  Diet recommendation: Low salt diet  Filed Weights   01/12/14 0506 01/13/14 0444 01/14/14 0543  Weight: 103.874 kg (229 lb) 100.426 kg (221 lb 6.4 oz) 98.204 kg (216 lb 8 oz)    History of present illness:  43 y.o. male with Past medical history of hypertension and alcohol abuse. The patient presents with complaints of nausea vomiting cough stomach distention and paresthesias. He drinks half a pint to a pint and a daily basis of VODKA, his last drink was yesterday afternoon. He has been drinking heavily over last 20 years. And has not been able to eat anything since last few days and does not have an balanced diet at his baseline. He mentions since last 6 months he has noted distention of his stomach progressively getting worse gradually. He also has sand-like sensation in his legs for last few months staying the same. He has been trying to cut back on his alcohol and has been having jitteriness and tremors every now and then. He has noted that his skin has been yellow over last few weeks and since last 2 nights his eyes have been progressively yellow which made him to come to the hospital.  Since last month he has been having on and off episodes of nausea and vomiting and mentions he is unable to eat regular size meal and has to eat in small portions. He also has noted cough associated with vomiting since last few weeks. He denies any diarrhea and has  1-2 daily bowel motions with regular consistency but mentions the color as dark maroon. He also mentions his vomitus has been occasionally dark color as well as occasional darker sputum with the cough. Last night he has significant bout of cough and started having complaints of pain in his lower ribs both sides and the pain has progressively worsened. Pain feels like sharp muscle aches. He denies any similar episode in the past. He denies any drug abuse, Tylenol abuse, use of NSAIDs, use of any prescription medications or herbal supplements, smoking   Hospital Course:   Melena; coffee ground Emesis;  Patient was started on octreotide, Protonix and ceftriaxone for SBP prophylaxis.  Endoscopy showed no esophageal varices, 3-4 cm hiatal hernia, portal hypertension gastropathy, gross evidence of long segment Barrett's esophagus. No more coffee-ground emesis. At this time patient has completed 7 days of Rocephin, will discontinue Rocephin as per GI recommendation Will discharge on Protonix 40 mg by mouth twice a day  Alcoholic Hepatitis; Hyperbilirubinemia, ascites.  GI consulted. Started on steroids.  LFTs continue to be elevated CT abdomen showed: portal venous HTN, enlarged liver with diffuse hepatic steatosis. Focal pancreatitis.  Started on spironolactone.  Hepatitis panel negative.  Bilirubin is is down to 23 Patient started on vitamin K 5 mg by mouth daily per GI, will continue vitamin K 5 mg daily for 3 more days. Called and discussed with GI Dr. Fuller Plan, versus okay to discharge the patient and follow-up  with Dr. Benson Norway in one week. Patient will be discharged on prednisone 50 mg by mouth daily for 4 weeks, and then prednisone need to be tapered as per GI.  Alcohol use;  Patient was started on CIWA protocol.  No signs or symptoms of alcohol withdrawal Will give Ativan 0.5 mg by mouth daily for anxiety as needed. Will give 10 tablets Patient strongly encouraged to quit drinking  alcohol.  ? UTI; Was started on Ceftriaxone. Urine culture showed no growth Ceftriaxone has been discontinued.  Procedures:  EGD  Consultations:  GI*  Discharge Exam: Filed Vitals:   01/14/14 0543  BP: 133/87  Pulse: 78  Temp: 98.4 F (36.9 C)  Resp: 18    General: Appears in no acute distress Cardiovascular: S1-S2 regular Respiratory: Clear bilaterally  Discharge Instructions You were cared for by a hospitalist during your hospital stay. If you have any questions about your discharge medications or the care you received while you were in the hospital after you are discharged, you can call the unit and asked to speak with the hospitalist on call if the hospitalist that took care of you is not available. Once you are discharged, your primary care physician will handle any further medical issues. Please note that NO REFILLS for any discharge medications will be authorized once you are discharged, as it is imperative that you return to your primary care physician (or establish a relationship with a primary care physician if you do not have one) for your aftercare needs so that they can reassess your need for medications and monitor your lab values.  Discharge Instructions    Diet - low sodium heart healthy    Complete by:  As directed      Increase activity slowly    Complete by:  As directed           Current Discharge Medication List    START taking these medications   Details  folic acid (FOLVITE) 1 MG tablet Take 1 tablet (1 mg total) by mouth daily. Qty: 30 tablet, Refills: 0    furosemide (LASIX) 40 MG tablet Take 1 tablet (40 mg total) by mouth daily. Qty: 30 tablet, Refills: 2    LORazepam (ATIVAN) 0.5 MG tablet Take 1 tablet (0.5 mg total) by mouth daily as needed for anxiety. Qty: 10 tablet, Refills: 0    ondansetron (ZOFRAN) 4 MG tablet Take 1 tablet (4 mg total) by mouth every 6 (six) hours as needed for nausea. Qty: 20 tablet, Refills: 0     pantoprazole (PROTONIX) 40 MG tablet Take 1 tablet (40 mg total) by mouth 2 (two) times daily. Qty: 60 tablet, Refills: 1    phytonadione (VITAMIN K) 5 MG tablet Take 1 tablet (5 mg total) by mouth daily. Qty: 3 tablet, Refills: 0    predniSONE (DELTASONE) 50 MG tablet Take 1 tablet (50 mg total) by mouth daily with breakfast. Qty: 30 tablet, Refills: 1    spironolactone (ALDACTONE) 100 MG tablet Take 1 tablet (100 mg total) by mouth daily. Qty: 30 tablet, Refills: 2      CONTINUE these medications which have NOT CHANGED   Details  Cholecalciferol (VITAMIN D3) 5000 UNITS TABS Take 1 tablet by mouth 3 (three) times a week.    Glucosamine-Chondroit-Vit C-Mn (GLUCOSAMINE 1500 COMPLEX PO) Take 1 tablet by mouth daily.      STOP taking these medications     lisinopril-hydrochlorothiazide (PRINZIDE,ZESTORETIC) 20-25 MG per tablet        No  Known Allergies Follow-up Information    Follow up with HUNG,PATRICK D, MD. Schedule an appointment as soon as possible for a visit in 1 week.   Specialty:  Gastroenterology   Contact information:   [address] [place]  [address] [phone]        The results of significant diagnostics from this hospitalization (including imaging, microbiology, ancillary and laboratory) are listed below for reference.    Significant Diagnostic Studies: Ct Abdomen Pelvis W Contrast  01/09/2014   CLINICAL DATA:  Nausea, vomiting, cough, abdominal pain and distention, jaundice, elevated bilirubin, bloody stools, history of ethanol abuse, hypertension, alcoholic hepatitis  EXAM: CT ABDOMEN AND PELVIS WITH CONTRAST  TECHNIQUE: Multidetector CT imaging of the abdomen and pelvis was performed using the standard protocol following bolus administration of intravenous contrast. Sagittal and coronal MPR images reconstructed from axial data set.  CONTRAST:  11m OMNIPAQUE IOHEXOL 300 MG/ML SOLN IV. Dilute oral contrast.  COMPARISON:  None   FINDINGS: Bibasilar atelectasis.  Suboptimal arterial phase, and delayed imaging versus contrast opacification.  Diffuse fatty infiltration of an enlarged liver without focal mass.  Spleen, kidneys, and adrenal glands normal appearance.  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  Additional collaterals are noted in the anterior abdomen as well.  Small hiatal hernia.  Edema identified at the second and third portions of the duodenum extending into proximal jejunum and small bowel mesentery, associated with mild enlargement and edematous changes of the pancreatic head suggesting focal pancreatitis.  Pancreatic body and tail grossly unremarkable.  Major venous structures patent.  Fluid/thickening of the LEFT anterior pararenal and lateral conal fascia.  Mild gallbladder wall thickening, nonspecific in setting of ascites in abdomen and pelvis.  BILATERAL inguinal hernias containing fat and fluid.  Unable to exclude colonic wall thickening,colon unopacified and under distended.  Remaining small bowel loops normal.  Minimal bladder wall thickening.  No mass, adenopathy, free air, or acute osseous findings.  IMPRESSION: Enlarged liver with diffuse hepatic steatosis and ascites.  No gross focal hepatic mass identified though examination is suboptimal due to lack of arterial phase contrast.  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  Suspected focal pancreatitis involving the pancreatic head with associated edema of the second and third portions of the duodenum and fluid in pararenal space into mesentery ; correlation with serum amylase recommended.  Small hiatal and BILATERAL inguinal hernias.   Electronically Signed   By: MLavonia DanaM.D.   On: 01/09/2014 11:24   Dg Abd Acute W/chest  01/08/2014   CLINICAL DATA:  Ascites and elevated bilirubin.  EXAM: ACUTE ABDOMEN SERIES (ABDOMEN 2 VIEW & CHEST 1 VIEW)  COMPARISON:   None.  FINDINGS: Lungs are clear bilaterally. Heart size is normal. The trachea is midline. No evidence for free air. There is a round density in the left abdomen on the upright abdominal view but this is not clearly seen on the supine images. This density could be within bowel. Few gas-filled loops of small bowel. There is a small amount of gas and stool in the rectal region.  IMPRESSION: No acute chest findings.  Nonspecific bowel gas pattern.   Electronically Signed   By: AMarkus DaftM.D.   On: 01/08/2014 21:24    Microbiology: Recent Results (from the past 240 hour(s))  Urine culture     Status: None   Collection Time: 01/09/14  4:52 PM  Result Value Ref Range Status  Specimen Description URINE, CLEAN CATCH  Final   Special Requests NONE  Final   Culture  Setup Time   Final    01/09/2014 22:50 Performed at [place] Performed at [facility]   Final   Culture NO GROWTH Performed at [facility]   Final   Report Status 01/10/2014 FINAL  Final     Labs: Basic Metabolic Panel:  Recent Labs Lab 01/10/14 0150 01/11/14 0438 01/12/14 0355 01/13/14 0754 01/14/14 0437  NA 139 134* 133* 128* 132*  K 4.4 4.2 4.5 4.7 4.2  CL 98 95* 95* 91* 93*  CO2 27 27 26 26 27   GLUCOSE 206* 152* 145* 113* 129*  BUN 12 12 12 16 15   CREATININE 0.51 0.45* 0.50 0.64 0.59  CALCIUM 8.0* 8.3* 8.5 8.6 8.4   Liver Function Tests:  Recent Labs Lab 01/10/14 0150 01/11/14 0438 01/12/14 0355 01/13/14 0754 01/14/14 0437  AST 104* 113* 141* 159* 158*  ALT 32 45 65* 90* 102*  ALKPHOS 119* 121* 130* 128* 126*  BILITOT 23.5* 23.3* 25.7* 25.3* 23.4*  PROT 5.9* 5.5* 5.9* 6.0 5.7*  ALBUMIN 2.3* 2.2* 2.2* 2.2* 2.1*    Recent Labs Lab 01/08/14 1645  LIPASE 44   No results for input(s): AMMONIA in the last 168 hours. CBC:  Recent Labs Lab 01/08/14 1645 01/08/14 2029 01/10/14 0150 01/10/14 1331 01/11/14 0438  WBC 12.7* 10.4 7.4 11.8*  11.6*  NEUTROABS 10.4* 8.3*  --   --   --   HGB 12.4* 11.3* 11.4* 10.9* 11.0*  HCT 35.3* 32.1* 32.1* 31.1* 31.7*  MCV 110.0* 110.3* 112.2* 110.7* 111.2*  PLT 228 211 206 210 209   Cardiac Enzymes: No results for input(s): CKTOTAL, CKMB, CKMBINDEX, TROPONINI in the last 168 hours. BNP: BNP (last 3 results) No results for input(s): PROBNP in the last 8760 hours. CBG: No results for input(s): GLUCAP in the last 168 hours.     SignedEleonore Chiquito S  [facility] 01/14/2014, 9:08 AM

## 2015-06-17 IMAGING — CR DG ABDOMEN ACUTE W/ 1V CHEST
4 series · 4 of 4 positions shown · non-contrast
Comparison: None.

CLINICAL DATA: Ascites and elevated bilirubin.

EXAM:
ACUTE ABDOMEN SERIES (ABDOMEN 2 VIEW & CHEST 1 VIEW)

[w chest pa]
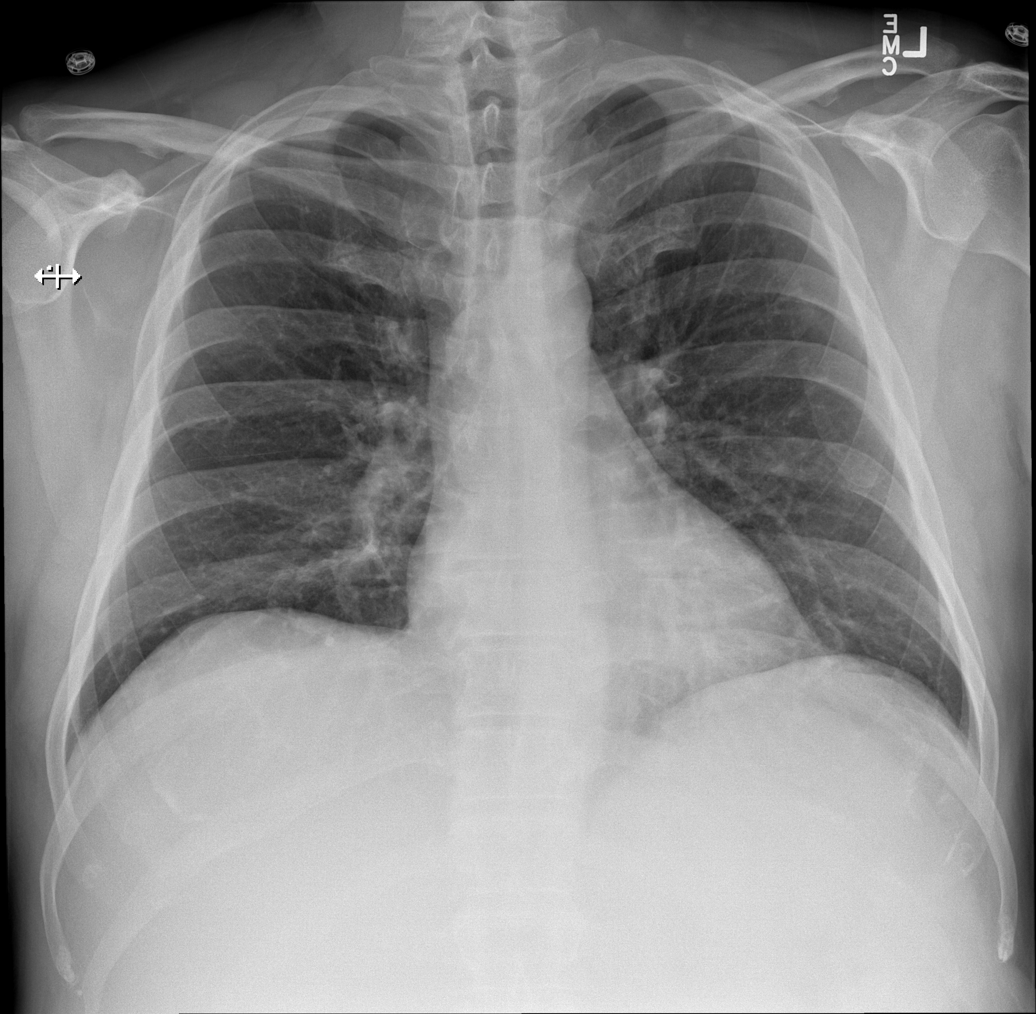

[w abdomen upright]
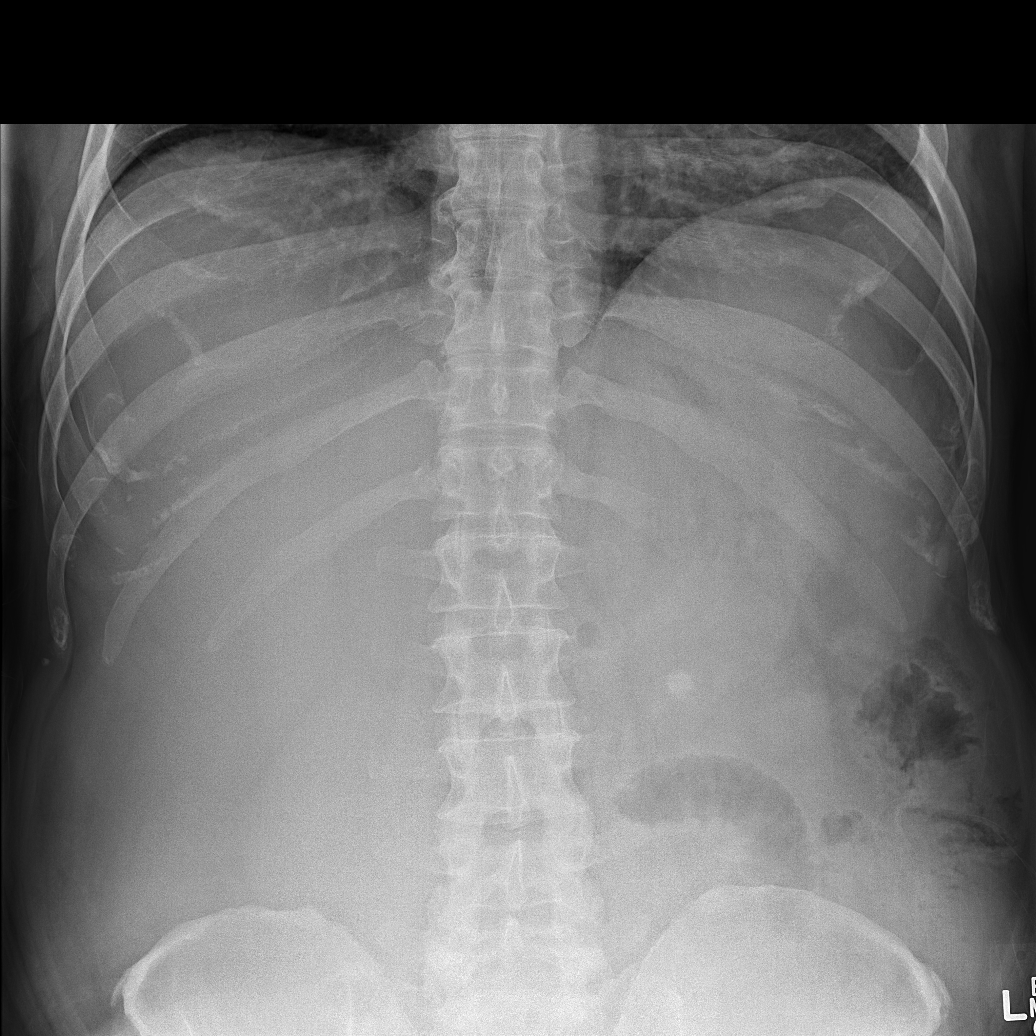

[t abdomen supine (1 of 2)]
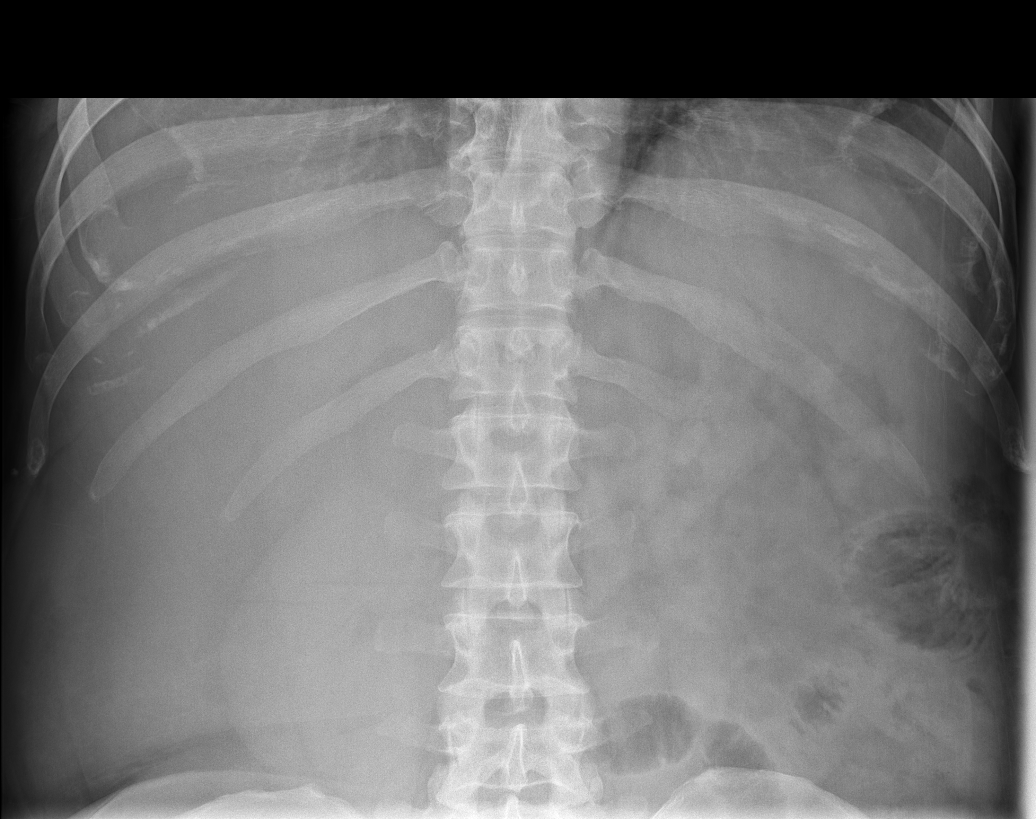

[t abdomen supine (2 of 2)]
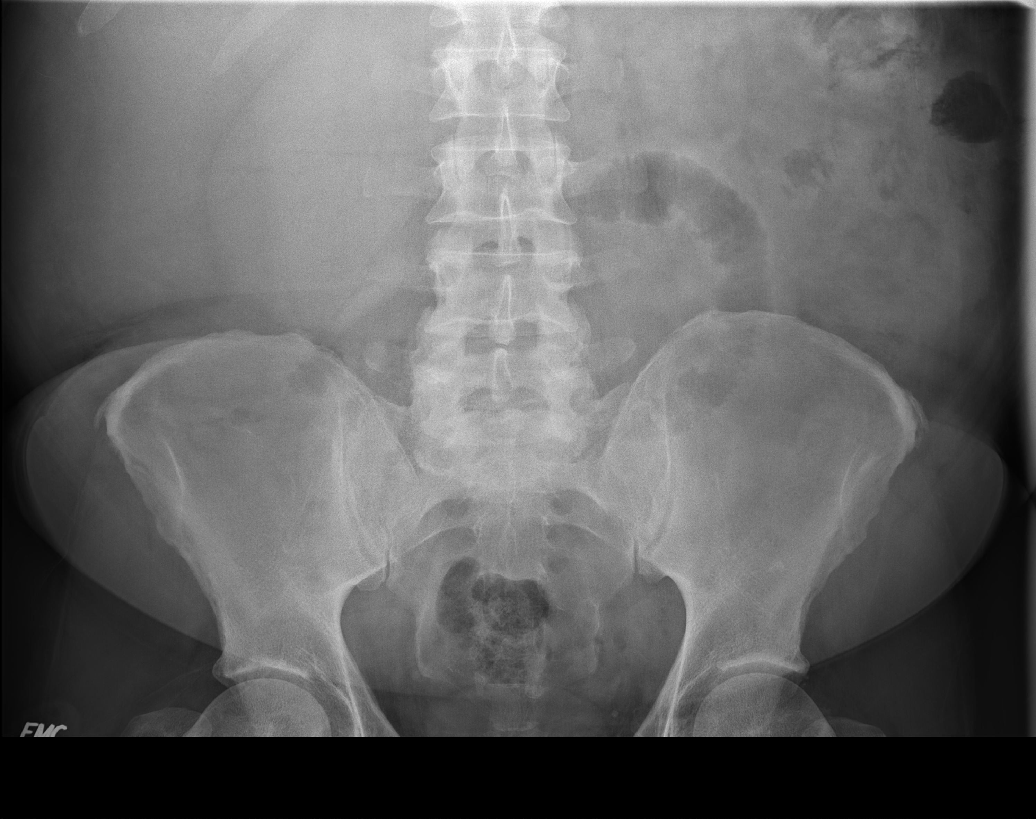

[4 of 4 positions shown; findings below may reference images not displayed]

FINDINGS: Lungs are clear bilaterally. Heart size is normal. The trachea is
midline. No evidence for free air. There is a round density in the
left abdomen on the upright abdominal view but this is not clearly
seen on the supine images. This density could be within bowel. Few
gas-filled loops of small bowel. There is a small amount of gas and
stool in the rectal region.
IMPRESSION: No acute chest findings.

Nonspecific bowel gas pattern.

## 2016-05-25 ENCOUNTER — Other Ambulatory Visit: Payer: Self-pay | Admitting: Gastroenterology

## 2016-05-25 DIAGNOSIS — K7030 Alcoholic cirrhosis of liver without ascites: Secondary | ICD-10-CM

## 2016-05-25 DIAGNOSIS — R17 Unspecified jaundice: Secondary | ICD-10-CM

## 2016-06-02 ENCOUNTER — Ambulatory Visit
Admission: RE | Admit: 2016-06-02 | Discharge: 2016-06-02 | Disposition: A | Discharge status: Home / Self Care | Payer: No Typology Code available for payment source | Source: Ambulatory Visit | Attending: Gastroenterology | Admitting: Gastroenterology

## 2016-06-02 DIAGNOSIS — R17 Unspecified jaundice: Secondary | ICD-10-CM

## 2016-06-02 DIAGNOSIS — K7030 Alcoholic cirrhosis of liver without ascites: Secondary | ICD-10-CM

## 2016-06-09 DIAGNOSIS — D649 Anemia, unspecified: Secondary | ICD-10-CM

## 2016-06-09 HISTORY — DX: Anemia, unspecified: D64.9

## 2016-06-12 ENCOUNTER — Inpatient Hospital Stay (HOSPITAL_COMMUNITY): Payer: Self-pay

## 2016-06-12 ENCOUNTER — Encounter (HOSPITAL_COMMUNITY): Payer: Self-pay | Admitting: Emergency Medicine

## 2016-06-12 ENCOUNTER — Inpatient Hospital Stay (HOSPITAL_COMMUNITY)
Admission: AD | Admit: 2016-06-12 | Discharge: 2016-06-22 | DRG: 432 | Disposition: A | Discharge status: Home / Self Care | Payer: Self-pay | Source: Ambulatory Visit | Attending: Internal Medicine | Admitting: Internal Medicine

## 2016-06-12 DIAGNOSIS — K7460 Unspecified cirrhosis of liver: Secondary | ICD-10-CM | POA: Insufficient documentation

## 2016-06-12 DIAGNOSIS — K7290 Hepatic failure, unspecified without coma: Secondary | ICD-10-CM | POA: Diagnosis present

## 2016-06-12 DIAGNOSIS — E876 Hypokalemia: Secondary | ICD-10-CM | POA: Diagnosis not present

## 2016-06-12 DIAGNOSIS — K3189 Other diseases of stomach and duodenum: Secondary | ICD-10-CM | POA: Diagnosis present

## 2016-06-12 DIAGNOSIS — E875 Hyperkalemia: Secondary | ICD-10-CM | POA: Diagnosis present

## 2016-06-12 DIAGNOSIS — D689 Coagulation defect, unspecified: Secondary | ICD-10-CM | POA: Diagnosis present

## 2016-06-12 DIAGNOSIS — K767 Hepatorenal syndrome: Secondary | ICD-10-CM | POA: Diagnosis present

## 2016-06-12 DIAGNOSIS — N179 Acute kidney failure, unspecified: Secondary | ICD-10-CM

## 2016-06-12 DIAGNOSIS — K7011 Alcoholic hepatitis with ascites: Secondary | ICD-10-CM | POA: Diagnosis present

## 2016-06-12 DIAGNOSIS — K7040 Alcoholic hepatic failure without coma: Principal | ICD-10-CM | POA: Diagnosis present

## 2016-06-12 DIAGNOSIS — E669 Obesity, unspecified: Secondary | ICD-10-CM | POA: Diagnosis present

## 2016-06-12 DIAGNOSIS — D62 Acute posthemorrhagic anemia: Secondary | ICD-10-CM | POA: Diagnosis present

## 2016-06-12 DIAGNOSIS — K766 Portal hypertension: Secondary | ICD-10-CM

## 2016-06-12 DIAGNOSIS — K2270 Barrett's esophagus without dysplasia: Secondary | ICD-10-CM | POA: Diagnosis present

## 2016-06-12 DIAGNOSIS — I10 Essential (primary) hypertension: Secondary | ICD-10-CM | POA: Diagnosis present

## 2016-06-12 DIAGNOSIS — I8500 Esophageal varices without bleeding: Secondary | ICD-10-CM | POA: Diagnosis present

## 2016-06-12 DIAGNOSIS — E871 Hypo-osmolality and hyponatremia: Secondary | ICD-10-CM | POA: Diagnosis present

## 2016-06-12 DIAGNOSIS — K7010 Alcoholic hepatitis without ascites: Secondary | ICD-10-CM

## 2016-06-12 DIAGNOSIS — K7031 Alcoholic cirrhosis of liver with ascites: Secondary | ICD-10-CM | POA: Diagnosis present

## 2016-06-12 DIAGNOSIS — N170 Acute kidney failure with tubular necrosis: Secondary | ICD-10-CM | POA: Diagnosis present

## 2016-06-12 DIAGNOSIS — F1011 Alcohol abuse, in remission: Secondary | ICD-10-CM | POA: Diagnosis present

## 2016-06-12 DIAGNOSIS — T444X5A Adverse effect of predominantly alpha-adrenoreceptor agonists, initial encounter: Secondary | ICD-10-CM | POA: Diagnosis not present

## 2016-06-12 DIAGNOSIS — K922 Gastrointestinal hemorrhage, unspecified: Secondary | ICD-10-CM | POA: Diagnosis present

## 2016-06-12 DIAGNOSIS — E872 Acidosis: Secondary | ICD-10-CM | POA: Diagnosis present

## 2016-06-12 DIAGNOSIS — F1010 Alcohol abuse, uncomplicated: Secondary | ICD-10-CM | POA: Diagnosis present

## 2016-06-12 DIAGNOSIS — Z6841 Body Mass Index (BMI) 40.0 and over, adult: Secondary | ICD-10-CM

## 2016-06-12 DIAGNOSIS — K449 Diaphragmatic hernia without obstruction or gangrene: Secondary | ICD-10-CM | POA: Diagnosis present

## 2016-06-12 DIAGNOSIS — K921 Melena: Secondary | ICD-10-CM

## 2016-06-12 DIAGNOSIS — T380X5A Adverse effect of glucocorticoids and synthetic analogues, initial encounter: Secondary | ICD-10-CM | POA: Diagnosis not present

## 2016-06-12 DIAGNOSIS — D72829 Elevated white blood cell count, unspecified: Secondary | ICD-10-CM | POA: Diagnosis present

## 2016-06-12 HISTORY — DX: Other ascites: R18.8

## 2016-06-12 HISTORY — DX: Coagulation defect, unspecified: D68.9

## 2016-06-12 HISTORY — DX: Anemia, unspecified: D64.9

## 2016-06-12 HISTORY — DX: Alcoholic cirrhosis of liver without ascites: K70.30

## 2016-06-12 LAB — COMPREHENSIVE METABOLIC PANEL
ALBUMIN: 1.3 g/dL — AB (ref 3.5–5.0)
ALK PHOS: 90 U/L (ref 38–126)
ALT: 93 U/L — ABNORMAL HIGH (ref 17–63)
ALT: 112 U/L — AB (ref 17–63)
ANION GAP: 21 — AB (ref 5–15)
AST: 255 U/L — ABNORMAL HIGH (ref 15–41)
AST: 332 U/L — AB (ref 15–41)
Albumin: 1.4 g/dL — ABNORMAL LOW (ref 3.5–5.0)
Alkaline Phosphatase: 92 U/L (ref 38–126)
Anion gap: 21 — ABNORMAL HIGH (ref 5–15)
BILIRUBIN TOTAL: 22.8 mg/dL — AB (ref 0.3–1.2)
BILIRUBIN TOTAL: 21.5 mg/dL — AB (ref 0.3–1.2)
BUN: 133 mg/dL — AB (ref 6–20)
BUN: 134 mg/dL — AB (ref 6–20)
CALCIUM: 7.2 mg/dL — AB (ref 8.9–10.3)
CO2: 11 mmol/L — ABNORMAL LOW (ref 22–32)
CO2: 9 mmol/L — AB (ref 22–32)
CREATININE: 4.77 mg/dL — AB (ref 0.61–1.24)
Calcium: 7.6 mg/dL — ABNORMAL LOW (ref 8.9–10.3)
Chloride: 97 mmol/L — ABNORMAL LOW (ref 101–111)
Chloride: 96 mmol/L — ABNORMAL LOW (ref 101–111)
Creatinine, Ser: 4.77 mg/dL — ABNORMAL HIGH (ref 0.61–1.24)
GFR calc Af Amer: 16 mL/min — ABNORMAL LOW (ref >60)
GFR calc non Af Amer: 14 mL/min — ABNORMAL LOW (ref >60)
GFR, EST AFRICAN AMERICAN: 16 mL/min — AB (ref >60)
GFR, EST NON AFRICAN AMERICAN: 14 mL/min — AB (ref >60)
Glucose, Bld: 92 mg/dL (ref 65–99)
Glucose, Bld: 99 mg/dL (ref 65–99)
POTASSIUM: 5.2 mmol/L — AB (ref 3.5–5.1)
Potassium: 5.5 mmol/L — ABNORMAL HIGH (ref 3.5–5.1)
SODIUM: 126 mmol/L — AB (ref 135–145)
Sodium: 129 mmol/L — ABNORMAL LOW (ref 135–145)
TOTAL PROTEIN: 5.2 g/dL — AB (ref 6.5–8.1)
TOTAL PROTEIN: 5.1 g/dL — AB (ref 6.5–8.1)

## 2016-06-12 LAB — CBC
HEMATOCRIT: 20.7 % — AB (ref 39.0–52.0)
Hemoglobin: 7.2 g/dL — ABNORMAL LOW (ref 13.0–17.0)
MCH: 35.8 pg — AB (ref 26.0–34.0)
MCHC: 34.8 g/dL (ref 30.0–36.0)
MCV: 103 fL — AB (ref 78.0–100.0)
PLATELETS: 386 10*3/uL (ref 150–400)
RBC: 2.01 MIL/uL — ABNORMAL LOW (ref 4.22–5.81)
RDW: 18.6 % — AB (ref 11.5–15.5)
WBC: 37.1 10*3/uL — ABNORMAL HIGH (ref 4.0–10.5)

## 2016-06-12 LAB — PROTIME-INR
INR: 2.84
Prothrombin Time: 30.5 seconds — ABNORMAL HIGH (ref 11.4–15.2)

## 2016-06-12 LAB — MRSA PCR SCREENING: MRSA BY PCR: NEGATIVE

## 2016-06-12 LAB — ABO/RH: ABO/RH(D): A POS

## 2016-06-12 MED ORDER — DEXTROSE 5 % IV SOLN
1.0000 g | INTRAVENOUS | Status: DC
  Administered 2016-06-12 – 2016-06-18 (×6): 1 g via INTRAVENOUS
  Filled 2016-06-12 (×7): qty 10

## 2016-06-12 MED ORDER — LORAZEPAM 2 MG/ML IJ SOLN
0.0000 mg | Freq: Four times a day (QID) | INTRAMUSCULAR | Status: AC
  Administered 2016-06-13: 1 mg via INTRAVENOUS
  Administered 2016-06-13: 2 mg via INTRAVENOUS
  Filled 2016-06-12 (×2): qty 1

## 2016-06-12 MED ORDER — OCTREOTIDE LOAD VIA INFUSION
50.0000 ug | Freq: Once | INTRAVENOUS | Status: AC
  Administered 2016-06-12: 50 ug via INTRAVENOUS
  Filled 2016-06-12: qty 25

## 2016-06-12 MED ORDER — ADULT MULTIVITAMIN W/MINERALS CH
1.0000 | ORAL_TABLET | Freq: Every day | ORAL | Status: DC
  Administered 2016-06-12 – 2016-06-22 (×10): 1 via ORAL
  Filled 2016-06-12 (×11): qty 1

## 2016-06-12 MED ORDER — SODIUM CHLORIDE 0.9 % IV BOLUS (SEPSIS)
1000.0000 mL | Freq: Once | INTRAVENOUS | Status: AC
  Administered 2016-06-12: 1000 mL via INTRAVENOUS

## 2016-06-12 MED ORDER — THIAMINE HCL 100 MG/ML IJ SOLN
100.0000 mg | Freq: Every day | INTRAMUSCULAR | Status: DC
  Administered 2016-06-13 – 2016-06-14 (×2): 100 mg via INTRAVENOUS
  Filled 2016-06-12 (×3): qty 2

## 2016-06-12 MED ORDER — PANTOPRAZOLE SODIUM 40 MG IV SOLR
40.0000 mg | Freq: Two times a day (BID) | INTRAVENOUS | Status: DC
  Administered 2016-06-13: 40 mg via INTRAVENOUS
  Filled 2016-06-12: qty 40

## 2016-06-12 MED ORDER — LORAZEPAM 2 MG/ML IJ SOLN: 0.0000 mg | Freq: Two times a day (BID) | INTRAMUSCULAR | Status: AC

## 2016-06-12 MED ORDER — VITAMIN B-1 100 MG PO TABS
100.0000 mg | ORAL_TABLET | Freq: Every day | ORAL | Status: DC
  Administered 2016-06-12 – 2016-06-22 (×9): 100 mg via ORAL
  Filled 2016-06-12 (×10): qty 1

## 2016-06-12 MED ORDER — SODIUM CHLORIDE 0.9 % IV SOLN
INTRAVENOUS | Status: DC
  Administered 2016-06-12: 18:00:00 via INTRAVENOUS

## 2016-06-12 MED ORDER — PANTOPRAZOLE SODIUM 40 MG IV SOLR
40.0000 mg | INTRAVENOUS | Status: DC
  Administered 2016-06-12: 40 mg via INTRAVENOUS
  Filled 2016-06-12: qty 40

## 2016-06-12 MED ORDER — LORAZEPAM 0.5 MG PO TABS: 0.5000 mg | ORAL_TABLET | Freq: Four times a day (QID) | ORAL | Status: DC | PRN

## 2016-06-12 MED ORDER — SODIUM CHLORIDE 0.9 % IV SOLN
Freq: Once | INTRAVENOUS | Status: AC
  Administered 2016-06-12: 22:00:00 via INTRAVENOUS

## 2016-06-12 MED ORDER — LORAZEPAM 2 MG/ML IJ SOLN: 1.0000 mg | Freq: Four times a day (QID) | INTRAMUSCULAR | Status: AC | PRN

## 2016-06-12 MED ORDER — LORAZEPAM 1 MG PO TABS
1.0000 mg | ORAL_TABLET | Freq: Four times a day (QID) | ORAL | Status: AC | PRN
  Administered 2016-06-12: 1 mg via ORAL
  Filled 2016-06-12: qty 1

## 2016-06-12 MED ORDER — FOLIC ACID 1 MG PO TABS
1.0000 mg | ORAL_TABLET | Freq: Every day | ORAL | Status: DC
  Administered 2016-06-12 – 2016-06-22 (×10): 1 mg via ORAL
  Filled 2016-06-12 (×11): qty 1

## 2016-06-12 MED ORDER — VITAMIN K1 10 MG/ML IJ SOLN
10.0000 mg | Freq: Every day | INTRAMUSCULAR | Status: AC
Start: 2016-06-12 — End: 2016-06-14
  Administered 2016-06-12 – 2016-06-14 (×3): 10 mg via SUBCUTANEOUS
  Filled 2016-06-12 (×3): qty 1

## 2016-06-12 MED ORDER — SODIUM CHLORIDE 0.9 % IV SOLN
INTRAVENOUS | Status: DC
  Administered 2016-06-13: 500 mL via INTRAVENOUS

## 2016-06-12 MED ORDER — OCTREOTIDE ACETATE 500 MCG/ML IJ SOLN
50.0000 ug/h | INTRAMUSCULAR | Status: DC
Start: 2016-06-12 — End: 2016-06-13
  Administered 2016-06-12: 50 ug/h via INTRAVENOUS
  Filled 2016-06-12 (×6): qty 1

## 2016-06-12 MED ORDER — ONDANSETRON HCL 4 MG/2ML IJ SOLN
4.0000 mg | Freq: Four times a day (QID) | INTRAMUSCULAR | Status: DC
  Administered 2016-06-12 – 2016-06-13 (×4): 4 mg via INTRAVENOUS
  Filled 2016-06-12 (×4): qty 2

## 2016-06-12 NOTE — Progress Notes (Signed)
Called office to contact physician on call. Patient is requesting medicine to help him relax and sleep. Stated he is very restless. All made her aware of bilirubin results. Will continue to monitor.

## 2016-06-12 NOTE — H&P (Signed)
History and Physical    Colin Hodges PRF:[id number] DOB: October 13, 1970 DOA: 06/12/2016  PCP: Patient, No Pcp Per  Patient coming from:  home  Chief Complaint:  melena  HPI: Colin Hodges is a 46 y.o. male with medical history significant of cirrhosis of the liver, etoh abuse, HTN, esophageal varices, gastritis was admitted earlier today by GI dr hung for melena and EGD in the am.  Labs were ordered and called to on call GI MD dr pyrtle who then asked [place] to get involved in patients care.  Pt had labs done 2 weeks ago with dr Colin Hodges which was a hgb over 11, normal creatinine per report.  Pt has noticed jaundice for over a week.  He says he only drinks "socially" which last time was a month ago.  He does not drink etoh nearly as much as he did years ago.  He was diagnosed with cirrhosis in 2015.  He denies any fevers at home.  He started with melena over the last several days for which he reported to dr hung who directly admitted him for scoping.  He reports adequate uop.  No urinary symptoms.  He vomited once yesterday which was nonbloody per his report.  He denies any sob, chest pain or abdominal pain.  Pt had cr of almost 5 today, with markedly abnormal bili levels, lfts, albumin level.  He has been put on protonix bid, along with an octreotide drip.  He has not had any active bleeding in the last several hours.  His bp is stable.  He is overtly jaundiced.  Also placed on rocephin.  inr is almost 3.  Dr Hilarie Fredrickson has requested [facility] to take over as primary team due to renal involvement, which I have agreed to do so.  Pt has been on ns at 100cc/hour, no bolus given yet.     Review of Systems: As per HPI otherwise 10 point review of systems negative.   Past Medical History:  Diagnosis Date  . Alcoholic hepatitis   . Hypertension     Past Surgical History:  Procedure Laterality Date  . APPENDECTOMY    . ESOPHAGOGASTRODUODENOSCOPY N/A 01/09/2014   Procedure: ESOPHAGOGASTRODUODENOSCOPY (EGD);  Surgeon:  Beryle Beams, MD;  Location: [facility];  Service: Endoscopy;  Laterality: N/A;     reports that he has never smoked. He has never used smokeless tobacco. He reports that he drinks alcohol. He reports that he does not use drugs.  No Known Allergies  History reviewed. No pertinent family history. no heart disease  Prior to Admission medications   Medication Sig Start Date End Date Taking? Authorizing Provider  Cholecalciferol (VITAMIN D3) 5000 UNITS TABS Take 1 tablet by mouth 3 (three) times a week.    [provider]  folic acid (FOLVITE) 1 MG tablet Take 1 tablet (1 mg total) by mouth daily. 01/14/14   Oswald Hillock, MD  furosemide (LASIX) 40 MG tablet Take 1 tablet (40 mg total) by mouth daily. 01/14/14   Oswald Hillock, MD  Glucosamine-Chondroit-Vit C-Mn (GLUCOSAMINE 1500 COMPLEX PO) Take 1 tablet by mouth daily.    [provider]  LORazepam (ATIVAN) 0.5 MG tablet Take 1 tablet (0.5 mg total) by mouth daily as needed for anxiety. 01/14/14   Oswald Hillock, MD  ondansetron (ZOFRAN) 4 MG tablet Take 1 tablet (4 mg total) by mouth every 6 (six) hours as needed for nausea. 01/14/14   Oswald Hillock, MD  pantoprazole (PROTONIX) 40 MG tablet Take  1 tablet (40 mg total) by mouth 2 (two) times daily. 01/14/14   Oswald Hillock, MD  phytonadione (VITAMIN K) 5 MG tablet Take 1 tablet (5 mg total) by mouth daily. 01/14/14   Oswald Hillock, MD  predniSONE (DELTASONE) 50 MG tablet Take 1 tablet (50 mg total) by mouth daily with breakfast. 01/14/14   Oswald Hillock, MD  spironolactone (ALDACTONE) 100 MG tablet Take 1 tablet (100 mg total) by mouth daily. 01/14/14   Oswald Hillock, MD    Physical Exam: Vitals:   06/12/16 1653 06/12/16 1900  BP: (!) 126/49 (!) 108/49  Pulse: 88 90  Resp: 19 (!) 22  Temp: 97.8 F (36.6 C) 97.7 F (36.5 C)  TempSrc: Oral Oral  SpO2: 100% 100%  Weight: 122.3 kg (269 lb 10 oz)   Height: 5' 10"  (1.778 m)       Constitutional: NAD, calm,  comfortable Vitals:   06/12/16 1653 06/12/16 1900  BP: (!) 126/49 (!) 108/49  Pulse: 88 90  Resp: 19 (!) 22  Temp: 97.8 F (36.6 C) 97.7 F (36.5 C)  TempSrc: Oral Oral  SpO2: 100% 100%  Weight: 122.3 kg (269 lb 10 oz)   Height: 5' 10"  (1.778 m)    Eyes: PERRL, lids and conjunctivae normal ENMT: Mucous membranes are moist. Posterior pharynx clear of any exudate or lesions.Normal dentition.  Neck: normal, supple, no masses, no thyromegaly Respiratory: clear to auscultation bilaterally, no wheezing, no crackles. Normal respiratory effort. No accessory muscle use.  Cardiovascular: Regular rate and rhythm, no murmurs / rubs / gallops. No extremity edema. 2+ pedal pulses. No carotid bruits.  Abdomen: no tenderness, no masses palpated. No hepatosplenomegaly. Bowel sounds positive.  Musculoskeletal: no clubbing / cyanosis. No joint deformity upper and lower extremities. Good ROM, no contractures. Normal muscle tone.  Skin: no rashes, lesions, ulcers. No induration Neurologic: CN 2-12 grossly intact. Sensation intact, DTR normal. Strength 5/5 in all 4.  Psychiatric: Normal judgment and insight. Alert and oriented x 3. Normal mood.    Labs on Admission: I have personally reviewed following labs and imaging studies  CBC:  Recent Labs Lab 06/12/16 1813  WBC 37.1*  HGB 7.2*  HCT 20.7*  MCV 103.0*  PLT 761   Basic Metabolic Panel:  Recent Labs Lab 06/12/16 1813  NA 129*  K 5.2*  CL 97*  CO2 11*  GLUCOSE 92  BUN 133*  CREATININE 4.77*  CALCIUM 7.6*   GFR: Estimated Creatinine Clearance: 25.9 mL/min (A) (by C-G formula based on SCr of 4.77 mg/dL (H)). Liver Function Tests:  Recent Labs Lab 06/12/16 1813  AST 255*  ALT 93*  ALKPHOS 92  BILITOT 22.8*  PROT 5.2*  ALBUMIN 1.4*   Coagulation Profile:  Recent Labs Lab 06/12/16 1813  INR 2.84    Recent Results (from the past 240 hour(s))  MRSA PCR Screening     Status: None   Collection Time: 06/12/16  5:12 PM   Result Value Ref Range Status   MRSA by PCR NEGATIVE NEGATIVE Final    Comment:        The GeneXpert MRSA Assay (FDA approved for NASAL specimens only), is one component of a comprehensive MRSA colonization surveillance program. It is not intended to diagnose MRSA infection nor to guide or monitor treatment for MRSA infections.      Radiological Exams on Admission: No results found.  Old chart [id number] Case discussed with GI dr pyrtle with [facility]   Assessment/Plan 45  yo male with h/o etoh cirrhosis of the liver with possible hepatorenal syndrome, GIB, acute blood loss anemia, h/o esophageal varices and gastritis  Principal Problem:   GI bleed- for EGD in am.  Cont with iv rocephin, octreotide and protonix 40m iv bid.  Serial h/h and transfuse if hgb drops below 7.  Currently stable, if becomes unstable will need scoping more emergently, GI on board.    Active Problems:   Hepatorenal syndrome ([facility]- probably.  Give 1 liter ivf bolus.  Repeat cmp now to reassess k level.  Cont ns 100cc/hour .  Obtain renal ultrasound and ua.  May need nephrology consult if not improved by am.   Acute blood loss anemia- h/h q 8 hours.  Transfuse if hgb drops below 7 or becomes unstable   Alcohol abuse- on ciwa protocol.  Pt only reports minimal etoh consumption   Decompensated hepatic cirrhosis ([facility]- noted, as above   Esophageal varices ([facility])- octreotide drip   Coagulopathy ([facility]- FFP x 2 units and vit k 169mim for 3 days.   Pt is high risk of acute decompensation and risk of death.  Advanced liver disease.  Unknown what work up has been done in past for cirrhosis, leave up to GI.  Pt aware of the severity of his illness.  Cont stepdown monitoring.      DVT prophylaxis:  scds  Code Status:   full Family Communication:  none Disposition Plan:  Per day team Consults called:   GI Admission status:  Admission, taking over patient care with GI as consultant    Damante Spragg A MD [facility]  If 7PM-7AM, please contact night-coverage [email or web address] Password [email or web address]  06/12/2016, 9:21 PM

## 2016-06-12 NOTE — H&P (Signed)
Colin Hodges HPI: Colin Hodges called yesterday stating that he had dark jelly-like stool. He reports working hard the other day and bending over and he noticed this change in his stools. He feels that his urine color has lightened up, but he does not report any significant change in his skin color or his sclera. Overnight he started to vomit and he reports that the material was very dark and at times with some blood. Colin Hodges feels weak at this time and he has some upper abdominal pain that is improved with PO intake. The pain is relatively new as he did not complain about this issue yesterday, however, he does remark that he has had intermittent pain for the past several months. He was in the hospital for ETOH hepatitis and there was suspicion about bleeding. An EGD was performed and there was gross evidence of Barrett's esophagus as well as large fundic varices. Blood was noted in the gastric lumen as well as a severe portal HTN gastropathy. At that time his MELD score was at 27 and this precluded a TIPS. BRTO was not pursued as he stabilized.  Past Medical History:  Diagnosis Date  . Alcoholic hepatitis   . Hypertension     Past Surgical History:  Procedure Laterality Date  . APPENDECTOMY    . ESOPHAGOGASTRODUODENOSCOPY N/A 01/09/2014   Procedure: ESOPHAGOGASTRODUODENOSCOPY (EGD);  Surgeon: Beryle Beams, MD;  Location: [facility];  Service: Endoscopy;  Laterality: N/A;    No family history on file.  Social History:  reports that he has never smoked. He does not have any smokeless tobacco history on file. He reports that he drinks alcohol. He reports that he does not use drugs.  Allergies: No Known Allergies  Medications: Scheduled: Continuous:  No results found for this or any previous visit (from the past 24 hour(s)).   No results found.  ROS:  As stated above in the HPI otherwise negative.  There were no vitals taken for this visit.    PE: Gen: NAD, Alert and Oriented, weak  appearing, severely jaundiced HEENT:  Colin Hodges/AT, EOMI Neck: Supple, no LAD Lungs: CTA Bilaterally CV: RRR without M/G/R ABM: Soft, obese, distended, but no clear evidence of ascites, +BS Ext: No C/C/E  Assessment/Plan: 1) Hepatic decompensation. 2) Hematemesis. 3) Epigastric abdominal pain. 4) Melena. 5) History of large fundic varices. 6) History of esophagitis/gross evidence of Barrett's.   I did not need to perform a rectal examination as he took a picture of his stool, per my request. It was clearly melenic. His breath was exuding an odor consistent with hepaticus feticus. The patient's abdomen was protuberant and grossly it appears that he has ascites, but most of the percussion showed that it was tympanic.  Additionally, his recent ultrasound was negative for ascites or any liver masses.  Gallbladder was distended and without stones. No clear evidence of intestinal obstruction. He is suffering with liver failure secondary to his ETOH consumption, although he has not drank since he last saw me in the office a couple of weeks ago. However, I am concerned that his liver was not able to tolerate any amount of ETOH as he reports lighter drinking.  The blood work in the office revealed his AST 117, ALT 33, and TB 33.   I will admit him and schedule him for an EGD with Dr. Hilarie Fredrickson, who is on call this weekend. I shared with him about my thoughts with regards to the severity of the situation.   Plan: 1) EGD  tomorrow. 2) Octreotide. 3) PPI. 4) CBC, CMET, PT/INR. 5 Ceftriaxone 1 gram QD. Wanza Szumski D 06/12/2016, 4:45 PM

## 2016-06-13 ENCOUNTER — Inpatient Hospital Stay (HOSPITAL_COMMUNITY): Payer: Self-pay

## 2016-06-13 ENCOUNTER — Encounter (HOSPITAL_COMMUNITY): Payer: Self-pay | Admitting: Physician Assistant

## 2016-06-13 ENCOUNTER — Inpatient Hospital Stay (HOSPITAL_COMMUNITY): Payer: Self-pay | Admitting: Anesthesiology

## 2016-06-13 ENCOUNTER — Encounter (HOSPITAL_COMMUNITY)
Admission: AD | Disposition: A | Discharge status: Home / Self Care | Payer: Self-pay | Source: Ambulatory Visit | Attending: Internal Medicine

## 2016-06-13 DIAGNOSIS — E875 Hyperkalemia: Secondary | ICD-10-CM

## 2016-06-13 DIAGNOSIS — K7200 Acute and subacute hepatic failure without coma: Secondary | ICD-10-CM

## 2016-06-13 DIAGNOSIS — E872 Acidosis: Secondary | ICD-10-CM

## 2016-06-13 DIAGNOSIS — I8501 Esophageal varices with bleeding: Secondary | ICD-10-CM

## 2016-06-13 DIAGNOSIS — E871 Hypo-osmolality and hyponatremia: Secondary | ICD-10-CM

## 2016-06-13 DIAGNOSIS — N179 Acute kidney failure, unspecified: Secondary | ICD-10-CM

## 2016-06-13 DIAGNOSIS — N19 Unspecified kidney failure: Secondary | ICD-10-CM

## 2016-06-13 DIAGNOSIS — K921 Melena: Secondary | ICD-10-CM

## 2016-06-13 DIAGNOSIS — G9341 Metabolic encephalopathy: Secondary | ICD-10-CM

## 2016-06-13 DIAGNOSIS — D62 Acute posthemorrhagic anemia: Secondary | ICD-10-CM

## 2016-06-13 DIAGNOSIS — K7290 Hepatic failure, unspecified without coma: Secondary | ICD-10-CM

## 2016-06-13 HISTORY — PX: ESOPHAGOGASTRODUODENOSCOPY: SHX5428

## 2016-06-13 LAB — URINALYSIS, ROUTINE W REFLEX MICROSCOPIC
GLUCOSE, UA: NEGATIVE mg/dL
HGB URINE DIPSTICK: NEGATIVE
KETONES UR: NEGATIVE mg/dL
LEUKOCYTES UA: NEGATIVE
Nitrite: NEGATIVE
PROTEIN: NEGATIVE mg/dL
Specific Gravity, Urine: 1.011 (ref 1.005–1.030)
pH: 5 (ref 5.0–8.0)

## 2016-06-13 LAB — BASIC METABOLIC PANEL
Anion gap: 17 — ABNORMAL HIGH (ref 5–15)
BUN: 153 mg/dL — AB (ref 6–20)
CHLORIDE: 100 mmol/L — AB (ref 101–111)
CO2: 13 mmol/L — AB (ref 22–32)
CREATININE: 4.66 mg/dL — AB (ref 0.61–1.24)
Calcium: 6.5 mg/dL — ABNORMAL LOW (ref 8.9–10.3)
GFR calc Af Amer: 16 mL/min — ABNORMAL LOW (ref >60)
GFR calc non Af Amer: 14 mL/min — ABNORMAL LOW (ref >60)
Glucose, Bld: 135 mg/dL — ABNORMAL HIGH (ref 65–99)
Potassium: 5.1 mmol/L (ref 3.5–5.1)
Sodium: 130 mmol/L — ABNORMAL LOW (ref 135–145)

## 2016-06-13 LAB — OSMOLALITY, URINE: OSMOLALITY UR: 383 mosm/kg (ref 300–900)

## 2016-06-13 LAB — CBC
HEMATOCRIT: 16 % — AB (ref 39.0–52.0)
Hemoglobin: 5.5 g/dL — CL (ref 13.0–17.0)
MCH: 35.3 pg — ABNORMAL HIGH (ref 26.0–34.0)
MCHC: 34.4 g/dL (ref 30.0–36.0)
MCV: 102.6 fL — AB (ref 78.0–100.0)
PLATELETS: 221 10*3/uL (ref 150–400)
RBC: 1.56 MIL/uL — ABNORMAL LOW (ref 4.22–5.81)
RDW: 18.6 % — ABNORMAL HIGH (ref 11.5–15.5)
WBC: 27.1 10*3/uL — AB (ref 4.0–10.5)

## 2016-06-13 LAB — SODIUM, URINE, RANDOM

## 2016-06-13 LAB — PREPARE RBC (CROSSMATCH)

## 2016-06-13 SURGERY — EGD (ESOPHAGOGASTRODUODENOSCOPY)
Anesthesia: Monitor Anesthesia Care

## 2016-06-13 MED ORDER — PROPOFOL 500 MG/50ML IV EMUL
INTRAVENOUS | Status: DC | PRN
  Administered 2016-06-13: 75 ug/kg/min via INTRAVENOUS

## 2016-06-13 MED ORDER — ALBUMIN HUMAN 25 % IV SOLN
50.0000 g | Freq: Two times a day (BID) | INTRAVENOUS | Status: AC
  Administered 2016-06-13 – 2016-06-15 (×5): 50 g via INTRAVENOUS
  Filled 2016-06-13: qty 50
  Filled 2016-06-13: qty 100
  Filled 2016-06-13 (×8): qty 50
  Filled 2016-06-13: qty 100

## 2016-06-13 MED ORDER — RIFAXIMIN 550 MG PO TABS
550.0000 mg | ORAL_TABLET | Freq: Two times a day (BID) | ORAL | Status: DC
  Administered 2016-06-13 – 2016-06-22 (×18): 550 mg via ORAL
  Filled 2016-06-13 (×19): qty 1

## 2016-06-13 MED ORDER — SODIUM CHLORIDE 0.9 % IV SOLN
Freq: Once | INTRAVENOUS | Status: AC
  Administered 2016-06-13: 08:00:00 via INTRAVENOUS

## 2016-06-13 MED ORDER — MIDODRINE HCL 5 MG PO TABS
10.0000 mg | ORAL_TABLET | Freq: Three times a day (TID) | ORAL | Status: DC
  Administered 2016-06-14 – 2016-06-22 (×25): 10 mg via ORAL
  Filled 2016-06-13 (×25): qty 2

## 2016-06-13 MED ORDER — BUTAMBEN-TETRACAINE-BENZOCAINE 2-2-14 % EX AERO
INHALATION_SPRAY | CUTANEOUS | Status: DC | PRN
  Administered 2016-06-13: 1 via TOPICAL

## 2016-06-13 MED ORDER — PHENYLEPHRINE HCL 10 MG/ML IJ SOLN
INTRAVENOUS | Status: DC | PRN
  Administered 2016-06-13: 40 ug/min via INTRAVENOUS

## 2016-06-13 MED ORDER — LACTULOSE 10 GM/15ML PO SOLN
20.0000 g | Freq: Three times a day (TID) | ORAL | Status: DC
  Administered 2016-06-13 – 2016-06-22 (×27): 20 g via ORAL
  Filled 2016-06-13 (×28): qty 30

## 2016-06-13 MED ORDER — SODIUM BICARBONATE 8.4 % IV SOLN
INTRAVENOUS | Status: DC
  Administered 2016-06-13 – 2016-06-14 (×2): via INTRAVENOUS
  Filled 2016-06-13 (×7): qty 1000

## 2016-06-13 MED ORDER — EPHEDRINE SULFATE 50 MG/ML IJ SOLN
INTRAMUSCULAR | Status: DC | PRN
  Administered 2016-06-13: 25 mg via INTRAVENOUS

## 2016-06-13 MED ORDER — PREDNISOLONE 5 MG PO TABS
40.0000 mg | ORAL_TABLET | Freq: Every day | ORAL | Status: DC
  Administered 2016-06-14 – 2016-06-22 (×9): 40 mg via ORAL
  Filled 2016-06-13 (×10): qty 8

## 2016-06-13 NOTE — Interval H&P Note (Signed)
History and Physical Interval Note: Patient admitted by Dr. Benson [place] with severely decompensated cirrhosis He has been having melena and has acute posthemorrhagic anemia with significant drop in hemoglobin. Hemoglobin dropped to 5.5 and he is getting 2 units of packed cells with the second unit nearly completed. He is deeply jaundiced He is in acute renal failure likely related to hepatorenal syndrome. He has been seen by renal and started on HRS therapy. He is also encephalopathic and is on lactulose. We'll add rifaximin when able. Empiric antibiotics for GI bleed in the setting of cirrhosis with ascites Will need imaging of his liver at some point while here Plan for endoscopy with monitored anesthesia care to evaluate melena, GI bleeding in the setting of portal hypertension due to alcohol cirrhosis. Will need close monitoring for alcohol withdrawal and treatment thereof Last EGD several years ago had no esophageal varices but a large gastric varix. If this is the culprit lesion he will likely need evaluation by IR for consideration of BRTO +/- TIPS. HIGHER THAN BASELINE RISK.The nature of the procedure, as well as the risks, benefits, and alternatives were carefully and thoroughly reviewed with the patient. Ample time for discussion and questions allowed. The patient understood, was satisfied, and agreed to proceed.     06/13/2016 4:43 PM  Colin Hodges  has presented today for surgery, with the diagnosis of Hematemesis and cirrhosis  The various methods of treatment have been discussed with the patient and family. After consideration of risks, benefits and other options for treatment, the patient has consented to  Procedure(s): ESOPHAGOGASTRODUODENOSCOPY (EGD) (N/A) as a surgical intervention .  The patient's history has been reviewed, patient examined, no change in status, stable for surgery.  I have reviewed the patient's chart and labs.  Questions were answered to the patient's satisfaction.      Codi Kertz M

## 2016-06-13 NOTE — Anesthesia Preprocedure Evaluation (Signed)
Anesthesia Evaluation  Patient identified by MRN, date of birth, ID band Patient awake    Reviewed: Allergy & Precautions, NPO status , Patient's Chart, lab work & pertinent test results  Airway Mallampati: II  TM Distance: >3 FB Neck ROM: Full    Dental  (+) Teeth Intact, Dental Advisory Given   Pulmonary neg pulmonary ROS,    Pulmonary exam normal breath sounds clear to auscultation       Cardiovascular hypertension, Pt. on medications Normal cardiovascular exam Rhythm:Regular Rate:Normal     Neuro/Psych negative neurological ROS     GI/Hepatic GERD  Medicated,(+) Cirrhosis   Esophageal Varices and ascites  substance abuse  alcohol use, hematemesis   Endo/Other  Obesity   Renal/GU Renal disease (hepatorenal syndrome)     Musculoskeletal negative musculoskeletal ROS (+)   Abdominal   Peds  Hematology  (+) Blood dyscrasia, anemia ,   Anesthesia Other Findings Day of surgery medications reviewed with the patient.  Reproductive/Obstetrics                             Anesthesia Physical Anesthesia Plan  ASA: III  Anesthesia Plan: MAC   Post-op Pain Management:    Induction: Intravenous  Airway Management Planned: Nasal Cannula  Additional Equipment:   Intra-op Plan:   Post-operative Plan:   Informed Consent: I have reviewed the patients History and Physical, chart, labs and discussed the procedure including the risks, benefits and alternatives for the proposed anesthesia with the patient or authorized representative who has indicated his/her understanding and acceptance.   Dental advisory given  Plan Discussed with: CRNA and Anesthesiologist  Anesthesia Plan Comments: (Discussed risks/benefits/alternatives to MAC sedation including need for ventilatory support, hypotension, need for conversion to general anesthesia.  All patient questions answered.  Patient/guardian wishes  to proceed.)        Anesthesia Quick Evaluation

## 2016-06-13 NOTE — Anesthesia Procedure Notes (Signed)
Procedure Name: MAC Date/Time: 06/13/2016 4:54 PM Performed by: Lance Coon Pre-anesthesia Checklist: Patient identified, Emergency Drugs available, Suction available, Patient being monitored and Timeout performed Patient Re-evaluated:Patient Re-evaluated prior to inductionOxygen Delivery Method: Nasal cannula

## 2016-06-13 NOTE — Progress Notes (Signed)
Charge Nurse took critical lab for Hbg 5.5. Text paged on call provider via [place].

## 2016-06-13 NOTE — Progress Notes (Signed)
PROGRESS NOTE                                                                                                                                                                                                             Patient Demographics:    Colin Hodges, is a 46 y.o. male, DOB - November 15, 1970, KJZ:[id number]  Admit date - 06/12/2016   Admitting Physician Carol Ada, MD  Outpatient Primary MD for the patient is Patient, No Pcp Per  LOS - 1  Outpatient Specialists:Dr. Benson Norway (GI)  No chief complaint on file.      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 He reported feeling weak and had some epigastric discomfort. His girlfriend at bedside reports him to be getting confused for last few days. Patient reports poor by mouth intake and having some hematemesis as well. Denies any fall, loss of consciousness, chest pain, shortness of breath, fevers, abdominal pain or distention. Denies any dysuria or diarrhea. Girlfriend reports that he has not been drinking since he got hospitalized in 2015. He called Dr. Benson Norway and was hostile, to the hospital. 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 His MELD score was 27 at that time and could not get a TIPS.  On admission he had significant leukocytosis (37K) with a hemoglobin of 7.2 which dropped to 5.5 this morning. Chemistry showed sodium of 129, K of 5.2, chloride 97, CO2 of 11, BUN of 133 and creatinine of 4.77 and INR of 2.84. He had an anion gap of 21. Patient placed on octreotide drip, IV Protonix. aHe also received 2 units FFP on admission along with vitamin K. This morning given his acute drop in H&H he was ordered for 2 units PRBC.     Subjective:   She  denies any abdominal pain, further hematemesis or melena. As per girlfriend he seems confused.   Assessment  & Plan :    Principal Problem:    Decompensated hepatic cirrhosis (HCC) With associated upper GI bleed and coagulopathy.. Nothing by mouth. Continue octreotide  drip and IV PPI twice a day. Transfuse 2 units PRBC. Received 2 unit FFP and vitamin K on admission. Serial  H&H monitoring. Has mild abdominal distention with possible ascites, nontender. On empiric Rocephin for SBP prophylaxis. MELD score of 40. Also has associated uremic versus hepatic encephalopathy. GI following with plan on EGD today.   Overall prognosis is guarded. He is likely not a candidate for TIPS and definitely not a transplant candidate.     ?Hepatorenal syndrome ([place]) He had normal renal function in 2015. No recent labs since then. Now presenting with significant acute kidney injury and metabolic acidosis. Monitor strict I/O. Switch fluids to D5 with 3 A of bicarbonate. Avoid nephrotoxins. UA negative for infection. Check urine lites. Follow renal ultrasound. -Nephrology consulted.  Hyponatremia Secondary to decompensated liver disease versus HRS. Monitor with hydration.  Hepatic/uremic encephalopathy. Monitor closely.  Alcohol abuse Reports he has not been drinking since 2015 but reported to admitting hospitalist that he does drink occasionally. On CIWA.  Transaminitis Secondary to decompensated liver disease. Monitor.  Hyperkalemia Monitor on telemetry.  Leukocytosis Secondary to acute illness/SBP. Continue empiric Rocephin. Follow blood cultures.  Code Status : Full code  Family Communication  girlfriend at bedside  Disposition Plan  : Pending hospital course. Prognosis is guarded  Barriers For Discharge : Active symptoms  Consults  :   [facility]  Procedures  :  Renal ultrasound   DVT Prophylaxis  : SCDs/coagulopathy  Lab Results  Component Value Date   PLT 221  06/13/2016    Antibiotics  :    Anti-infectives    Start     Dose/Rate Route Frequency Ordered Stop   06/12/16 1800  cefTRIAXone (ROCEPHIN) 1 g in dextrose 5 % 50 mL IVPB     1 g 100 mL/hr over 30 Minutes Intravenous Every 24 hours 06/12/16 1706          Objective:   Vitals:   06/13/16 0303 06/13/16 0700 06/13/16 0836 06/13/16 0901  BP: (!) 132/57 (!) 119/49 (!) 128/54 (!) 112/52  Pulse: 93 88 92 91  Resp: (!) 21 (!) 26 (!) 24 17  Temp: 97.6 F (36.4 C) 98 F (36.7 C) 97.8 F (36.6 C) 97.7 F (36.5 C)  TempSrc: Oral Oral Oral Oral  SpO2: 100% 100% 100% 100%  Weight:      Height:        Wt Readings from Last 3 Encounters:  06/12/16 122.3 kg (269 lb 10 oz)  01/14/14 98.2 kg (216 lb 8 oz)  10/20/11 113.4 kg (250 lb)     Intake/Output Summary (Last 24 hours) at 06/13/16 1108 Last data filed at 06/13/16 1008  Gross per 24 hour  Intake           804.17 ml  Output              153 ml  Net           651.17 ml     Physical Exam  Gen: Middle aged male severely jaundiced, not in distress HEENT: Pallor present, icteric, moist mucosa, supple neck Chest: clear b/l, no added sounds CVS: N S1&S2, no murmurs, rubs or gallop GI: soft, mild abdominal distention, nontender, tympanic note to percussion, bowel sounds present Musculoskeletal: warm, trace edema bilaterally CNS: Alert and awake, seems oriented, flapping tremors    Data Review:    CBC  Recent Labs Lab 06/12/16 1813 06/13/16 0517  WBC 37.1* 27.1*  HGB 7.2* 5.5*  HCT 20.7* 16.0*  PLT 386 221  MCV 103.0* 102.6*  MCH 35.8* 35.3*  MCHC 34.8 34.4  RDW 18.6* 18.6*  Chemistries   Recent Labs Lab 06/12/16 1813 06/12/16 2028  NA 129* 126*  K 5.2* 5.5*  CL 97* 96*  CO2 11* 9*  GLUCOSE 92 99  BUN 133* 134*  CREATININE 4.77* 4.77*  CALCIUM 7.6* 7.2*  AST 255* 332*  ALT 93* 112*  ALKPHOS 92 90  BILITOT 22.8* 21.5*    ------------------------------------------------------------------------------------------------------------------ No results for input(s): CHOL, HDL, LDLCALC, TRIG, CHOLHDL, LDLDIRECT in the last 72 hours.  No results found for: HGBA1C ------------------------------------------------------------------------------------------------------------------ No results for input(s): TSH, T4TOTAL, T3FREE, THYROIDAB in the last 72 hours.  Invalid input(s): FREET3 ------------------------------------------------------------------------------------------------------------------ No results for input(s): VITAMINB12, FOLATE, FERRITIN, TIBC, IRON, RETICCTPCT in the last 72 hours.  Coagulation profile  Recent Labs Lab 06/12/16 1813  INR 2.84    No results for input(s): DDIMER in the last 72 hours.  Cardiac Enzymes No results for input(s): CKMB, TROPONINI, MYOGLOBIN in the last 168 hours.  Invalid input(s): CK ------------------------------------------------------------------------------------------------------------------ No results found for: BNP  Inpatient Medications  Scheduled Meds: . folic acid  1 mg Oral Daily  . LORazepam  0-4 mg Intravenous Q6H   Followed by  . [START ON 06/14/2016] LORazepam  0-4 mg Intravenous Q12H  . multivitamin with minerals  1 tablet Oral Daily  . ondansetron (ZOFRAN) IV  4 mg Intravenous Q6H  . pantoprazole (PROTONIX) IV  40 mg Intravenous Q12H  . phytonadione  10 mg Subcutaneous Daily  . thiamine  100 mg Oral Daily   Or  . thiamine  100 mg Intravenous Daily   Continuous Infusions: . sodium chloride    . cefTRIAXone (ROCEPHIN)  IV Stopped (06/12/16 1812)  . dextrose 5 % 1,000 mL with sodium bicarbonate 150 mEq infusion 100 mL/hr at 06/13/16 0949  . octreotide  (SANDOSTATIN)    IV infusion 50 mcg/hr (06/12/16 1754)   PRN Meds:.LORazepam **OR** LORazepam  Micro Results Recent Results (from the past 240 hour(s))  MRSA PCR Screening     Status: None    Collection Time: 06/12/16  5:12 PM  Result Value Ref Range Status   MRSA by PCR NEGATIVE NEGATIVE Final    Comment:        The GeneXpert MRSA Assay (FDA approved for NASAL specimens only), is one component of a comprehensive MRSA colonization surveillance program. It is not intended to diagnose MRSA infection nor to guide or monitor treatment for MRSA infections.     Radiology Reports US Abdomen Complete  Result Date: 06/02/2016 CLINICAL DATA:  Jaundice EXAM: ABDOMEN ULTRASOUND COMPLETE COMPARISON:  01/09/2014 FINDINGS: Gallbladder: Gallbladder is well distended with gallbladder wall thickening to 7.4 mm. Multiple calculi are noted. A negative sonographic Murphy's sign is noted. Common bile duct: Diameter: 5.4 mm. Liver: Diffusely increased in echogenicity consistent with fatty infiltration. This is similar to that seen on the prior CT examination. The liver is also enlarged in size. Phasic flow is noted within the portal vein with a to and fro component. IVC: No abnormality visualized. Pancreas: Visualized portion unremarkable. Spleen: Splenomegaly is noted. Splenic volume is approximately 1,500 cubic cm. Perisplenic varices are again noted. Right Kidney: Length: 14.3 cm. Echogenicity within normal limits. No mass or hydronephrosis visualized. Left Kidney: Length: 14.9 cm. Echogenicity within normal limits. No mass or hydronephrosis visualized. Abdominal aorta: No aneurysm visualized. Other findings: Mild ascites is noted. IMPRESSION: Gallbladder is well distended with evidence of cholelithiasis and gallbladder wall thickening. The wall thickening may be in part due to the underlying ascites. No sonographic Percell Miller sign is noted. Hepatosplenomegaly with diffuse  fatty infiltration of the liver similar to that seen on prior CT examination. Perisplenic varices consistent with a degree of portal hypertension. Electronically Signed   By: Inez Catalina M.D.   On: 06/02/2016 10:05   US Renal  Result  Date: 06/13/2016 CLINICAL DATA:  Acute kidney injury EXAM: RENAL / URINARY TRACT ULTRASOUND COMPLETE COMPARISON:  None. FINDINGS: Right Kidney: Length: 15.5 cm. Echogenicity within normal limits. No mass or hydronephrosis visualized. Left Kidney: Length: 15.5 cm. Echogenicity within normal limits. No mass or hydronephrosis visualized. Bladder: Appears normal for degree of bladder distention. Peritoneal ascites is visible in the perihepatic space. IMPRESSION: Normal kidneys and bladder.  Peritoneal ascites noted. Electronically Signed   By: Andreas Newport M.D.   On: 06/13/2016 06:20    Time Spent in minutes 35   Louellen Molder M.D on 06/13/2016 at 11:08 AM  Between 7am to 7pm - Pager - [phone]  After 7pm go to [email or web address] - password [facility]  [facility] -  Office  [phone]

## 2016-06-13 NOTE — Consult Note (Addendum)
Renal Service Consult Note [facility]  Colin Hodges 06/13/2016 Colin Hodges Requesting Physician:  Dr Colin Hodges   Reason for Consult:  Acute renal failure HPI: The patient is a 46 y.o. year-old with history of alcohol abuse, cirrhosis dx'Hodges 2 yrs ago, admitted last night for GI bleed and new renal failure.  Reportedly had a procedure a few wks ago and creatinine was "normal".  He was seen by yesterday for N/V and coffee ground emesis w upper abd pain.    ONly admission here was 2015 for HTN, etoh abuse with N/V.  Had 20 yr hx etoh abuse drinking of hard liquor, 1/2 to 1 pint daily.  Had melena and was treated with octreotide, PPI, Rocephion for SBP proph.  EGD showed severe portal HTN gastropathy and large gastric fundus varices, no esoph varcies, and +Barrett's. rx'Hodges with prednisone for ETOH hepatitis.   On admission WBC was 37k, down to 27k today.  Creat was 4.77 w/ BUN 134.    Pt and GF provide history, they have lived together many years. No hx renal failure, no nsaids at home.  Poor UOP recently.  He has been confused per GF.    Home meds> lasix 40/Hodges, ativan, PPI, prednisone, aldactone   ROS  denies CP  no joint pain   no HA  no blurry vision  no rash  no dysuria    Past Medical History  Past Medical History:  Diagnosis Date  . Alcoholic hepatitis 6967, 89/3810   Discriminant function score 53 06/13/16: started 28 day course Prednisolone.    . Anemia 06/2016   in setting of GI bleed with melena  . Ascites 2015  . Cirrhosis, alcoholic ([place]) 1751  . Coagulopathy ([place]) 2015 and 06/2016.  Marland Kitchen Hypertension    Past Surgical History  Past Surgical History:  Procedure Laterality Date  . APPENDECTOMY    . ESOPHAGOGASTRODUODENOSCOPY N/A 01/09/2014   Dr Colin Hodges.  Long segment Barrett's, HH, severe portal gastropathy, GOV 2 gastric varix.     Family History History reviewed. No pertinent family history. Social History  reports that he has never smoked. He has  never used smokeless tobacco. He reports that he drinks alcohol. He reports that he does not use drugs. Allergies No Known Allergies Home medications Prior to Admission medications   Medication Sig Start Date End Date Taking? Authorizing Provider  furosemide (LASIX) 40 MG tablet Take 1 tablet (40 mg total) by mouth daily. 01/14/14  Yes Oswald Hillock, MD  Cholecalciferol (VITAMIN D3) 5000 UNITS TABS Take 1 tablet by mouth 3 (three) times a week.    [provider]  folic acid (FOLVITE) 1 MG tablet Take 1 tablet (1 mg total) by mouth daily. 01/14/14   Oswald Hillock, MD  Glucosamine-Chondroit-Vit C-Mn (GLUCOSAMINE 1500 COMPLEX PO) Take 1 tablet by mouth daily.    [provider]  LORazepam (ATIVAN) 0.5 MG tablet Take 1 tablet (0.5 mg total) by mouth daily as needed for anxiety. Patient not taking: Reported on 06/13/2016 01/14/14   Oswald Hillock, MD  ondansetron (ZOFRAN) 4 MG tablet Take 1 tablet (4 mg total) by mouth every 6 (six) hours as needed for nausea. Patient not taking: Reported on 06/13/2016 01/14/14   Oswald Hillock, MD  pantoprazole (PROTONIX) 40 MG tablet Take 1 tablet (40 mg total) by mouth 2 (two) times daily. Patient not taking: Reported on 06/13/2016 01/14/14   Oswald Hillock, MD  phytonadione (VITAMIN K) 5 MG tablet Take 1  tablet (5 mg total) by mouth daily. Patient not taking: Reported on 06/13/2016 01/14/14   Oswald Hillock, MD  predniSONE (DELTASONE) 50 MG tablet Take 1 tablet (50 mg total) by mouth daily with breakfast. Patient not taking: Reported on 06/13/2016 01/14/14   Oswald Hillock, MD  spironolactone (ALDACTONE) 100 MG tablet Take 1 tablet (100 mg total) by mouth daily. 01/14/14   Oswald Hillock, MD   Liver Function Tests  Recent Labs Lab 06/12/16 1813 06/12/16 2028  AST 255* 332*  ALT 93* 112*  ALKPHOS 92 90  BILITOT 22.8* 21.5*  PROT 5.2* 5.1*  ALBUMIN 1.4* 1.3*   No results for input(s): LIPASE, AMYLASE in the last 168 hours. CBC  Recent Labs Lab  06/12/16 1813 06/13/16 0517  WBC 37.1* 27.1*  HGB 7.2* 5.5*  HCT 20.7* 16.0*  MCV 103.0* 102.6*  PLT 386 782   Basic Metabolic Panel  Recent Labs Lab 06/12/16 1813 06/12/16 2028  NA 129* 126*  K 5.2* 5.5*  CL 97* 96*  CO2 11* 9*  GLUCOSE 92 99  BUN 133* 134*  CREATININE 4.77* 4.77*  CALCIUM 7.6* 7.2*   Iron/TIBC/Ferritin/ %Sat No results found for: IRON, TIBC, FERRITIN, IRONPCTSAT  Vitals:   06/13/16 1156 06/13/16 1242 06/13/16 1300 06/13/16 1323  BP:  (!) 119/54 (!) 126/50 (!) 132/58  Pulse:  92 91 90  Resp:  (!) 22    Temp: 98.1 F (36.7 C) 97.6 F (36.4 C) 97.6 F (36.4 C) 98 F (36.7 C)  TempSrc: Oral Oral Oral Oral  SpO2:  100% 99% 100%  Weight:      Height:       Exam Gen marked jaundice, mild confusion, somnolent No rash, cyanosis or gangrene Sclera anicteric, throat clear and dry  No jvd or bruits Chest clear bilat to bases RRR no MRG Abd sig ascites, no hsm, +bs GU normal male MS no joint effusions or deformity Ext trace LE edema / no wounds or ulcers Neuro is lethargic, Ox 2, nonfocal, mild myoclonic jerking at rest  Home meds> lasix 40/Hodges, ativan, PPI, prednisone, aldactone Renal US > 15 cm kidneys, no hydro UA > negative, amber UNa < 10,  Uosm 383  Assessment: 1. Acute renal failure - with low UNa in pt with advanced and decomp hepatic cirrhosis.  Suspect hepatorenal syndrome, also may have component of vol depletion per exam.  Recommend: follow protocol for HRS (midodrine, octreotide and IV albumin).  Agree with IVF"s at 100/hr.  Not HD candidate given severe comorbidities.  Hopefully can recover.  Have Hodges/w pt and his partner.  2. Cirrhosis, decomp - MELD is 40, poor prognosis 3. ^^WBC - on empiric Rocephin IV 4. Hyponatremia - typical of ESLD 5. AMS - prob combination renal / hepatic enceph 6. ETOH abuse 7. Hyperkalemia mild 8. Met acidosis - on bicarb gtt    Plan - as above, will follow  Colin Splinter MD [place] pager [phone]   06/13/2016, 2:29 PM

## 2016-06-13 NOTE — Anesthesia Postprocedure Evaluation (Signed)
Anesthesia Post Note  Patient: Colin Hodges  Procedure(s) Performed: Procedure(s) (LRB): ESOPHAGOGASTRODUODENOSCOPY (EGD) (N/A)  Patient location during evaluation: Endoscopy Anesthesia Type: MAC Level of consciousness: awake and alert Pain management: pain level controlled Vital Signs Assessment: post-procedure vital signs reviewed and stable Respiratory status: spontaneous breathing, nonlabored ventilation, respiratory function stable and patient connected to nasal cannula oxygen Cardiovascular status: stable and blood pressure returned to baseline Anesthetic complications: no       Last Vitals:  Vitals:   06/13/16 1750 06/13/16 1837  BP: (!) 143/37 (!) 112/47  Pulse: 85 99  Resp: (!) 21 (!) 23  Temp:  36.6 C    Last Pain:  Vitals:   06/13/16 1837  TempSrc: Oral                 Catalina Gravel

## 2016-06-13 NOTE — Transfer of Care (Signed)
Immediate Anesthesia Transfer of Care Note  Patient: Colin Hodges  Procedure(s) Performed: Procedure(s): ESOPHAGOGASTRODUODENOSCOPY (EGD) (N/A)  Patient Location: Endoscopy Unit  Anesthesia Type:MAC  Level of Consciousness: awake and patient cooperative  Airway & Oxygen Therapy: Patient Spontanous Breathing and Patient connected to nasal cannula oxygen  Post-op Assessment: Report given to RN and Post -op Vital signs reviewed and stable  Post vital signs: Reviewed and stable  Last Vitals:  Vitals:   06/13/16 1500 06/13/16 1551  BP:  (!) 143/48  Pulse:  88  Resp:  (!) 22  Temp: 36.8 C 36.6 C    Last Pain:  Vitals:   06/13/16 1551  TempSrc: Oral         Complications: No apparent anesthesia complications

## 2016-06-13 NOTE — Op Note (Signed)
[facility] Patient Name: Colin Hodges Procedure Date : 06/13/2016 MRN: [id number] Attending MD: Jerene Bears , MD Date of Birth: 11-08-1970 CSN: [id number] Age: 46 Admit Type: Inpatient Procedure:                Upper GI endoscopy Indications:              Acute post hemorrhagic anemia, Melena Providers:                Lajuan Lines. Hilarie Fredrickson, MD, Cleda Daub, RN, William Dalton, Technician Referring MD:             [facility] Medicines:                Monitored Anesthesia Care Complications:            No immediate complications. Estimated Blood Loss:     Estimated blood loss: none. Procedure:                Pre-Anesthesia Assessment:                           - Prior to the procedure, a History and Physical                            was performed, and patient medications and                            allergies were reviewed. The patient's tolerance of                            previous anesthesia was also reviewed. The risks                            and benefits of the procedure and the sedation                            options and risks were discussed with the patient.                            All questions were answered, and informed consent                            was obtained. Prior Anticoagulants: The patient has                            taken no previous anticoagulant or antiplatelet                            agents. ASA Grade Assessment: IV - A patient with                            severe systemic disease that is a constant threat  to life. After reviewing the risks and benefits,                            the patient was deemed in satisfactory condition to                            undergo the procedure.                           After obtaining informed consent, the endoscope was                            passed under direct vision. Throughout the   procedure, the patient's blood pressure, pulse, and                            oxygen saturations were monitored continuously. The                            EG-[id number] ([id number]) scope was introduced through the                            mouth, and advanced to the second part of duodenum.                            The upper GI endoscopy was accomplished without                            difficulty. The patient tolerated the procedure                            well. Scope In: Scope Out: Findings:      There were esophageal mucosal changes consistent with long-segment       Barrett's esophagus present in the lower third of the esophagus.       Biopsies not performed due to critical illness.      There is no endoscopic evidence of varices in the entire esophagus.      A 3 cm hiatal hernia was present.      Mild portal hypertensive gastropathy was found in the cardia, in the       gastric fundus and in the proximal gastric body. No evidence of bleeding       from gastropathy.      There is no endoscopic evidence of varices in the gastroesophageal       junction, in the cardia and in the gastric fundus.      Patchy hemorrhagic mucosa with fresh, small volume red blood was found       in the second portion of the duodenum. This area of irrigated and       lavaged copiously. Several angioectasias were seen, but they did not       appear to be the source of bleeding. No duodenal varices were seen. No       duodenal ulcers or erosions were seen. APC not performed given lack of       actively bleeding lesion and severe coagulopathy. Impression:               -  Esophageal mucosal changes consistent with                            long-segment Barrett's esophagus. Biopsies not                            performed given critical illness                           - 3 cm hiatal hernia.                           - Mild, nonbleeding, portal hypertensive                            gastropathy.                            - Hemorrhagic duodenopathy without brisk of                            definitively active bleeding.                           - No gastroesophageal varices.                           - No specimens collected. Moderate Sedation:      N/A Recommendation:           - Return patient to ICU/step-down for ongoing care.                           - Clear liquid diet [Duration].                           - Continue present medications.                           - Continue PPI gtt until tomorrow, and then likely                            change to BID dosing                           - Continue octreotide infusion given it's benefit                            in hepatorenal syndrome                           - Continue empiric antibiotics for GI bleeding in                            the setting of cirrhosis with ascites                           - Monitor Hgb closely, transfuse as needed. Goal  Hgb can be 8.0 given lack of varices.                           - Treat hepatic encephalopathy aggressively.                           - If further melena/drop in Hgb then will need to                            consider colonoscopy and or tagged RBC study. Procedure Code(s):        --- Professional ---                           [phone], Esophagogastroduodenoscopy, flexible,                            transoral; diagnostic, including collection of                            specimen(s) by brushing or washing, when performed                            (separate procedure) Diagnosis Code(s):        --- Professional ---                           K22.8, Other specified diseases of esophagus                           K44.9, Diaphragmatic hernia without obstruction or                            gangrene                           K76.6, Portal hypertension                           K31.89, Other diseases of stomach and duodenum                           K92.2,  Gastrointestinal hemorrhage, unspecified                           D62, Acute posthemorrhagic anemia                           K92.1, Melena (includes Hematochezia) CPT copyright 2016 [employer]. All rights reserved. The codes documented in this report are preliminary and upon coder review may  be revised to meet current compliance requirements. Jerene Bears, MD 06/13/2016 5:34:29 PM This report has been signed electronically. Number of Addenda: 0

## 2016-06-14 DIAGNOSIS — K922 Gastrointestinal hemorrhage, unspecified: Secondary | ICD-10-CM

## 2016-06-14 DIAGNOSIS — K767 Hepatorenal syndrome: Secondary | ICD-10-CM

## 2016-06-14 LAB — PREPARE FRESH FROZEN PLASMA
UNIT DIVISION: 0
Unit division: 0

## 2016-06-14 LAB — PREPARE RBC (CROSSMATCH)

## 2016-06-14 LAB — BASIC METABOLIC PANEL
ANION GAP: 17 — AB (ref 5–15)
Anion gap: 15 (ref 5–15)
BUN: 148 mg/dL — AB (ref 6–20)
BUN: 133 mg/dL — AB (ref 6–20)
CALCIUM: 6 mg/dL — AB (ref 8.9–10.3)
CALCIUM: 6.3 mg/dL — AB (ref 8.9–10.3)
CO2: 16 mmol/L — ABNORMAL LOW (ref 22–32)
CO2: 18 mmol/L — ABNORMAL LOW (ref 22–32)
Chloride: 98 mmol/L — ABNORMAL LOW (ref 101–111)
Chloride: 99 mmol/L — ABNORMAL LOW (ref 101–111)
Creatinine, Ser: 4.53 mg/dL — ABNORMAL HIGH (ref 0.61–1.24)
Creatinine, Ser: 4.28 mg/dL — ABNORMAL HIGH (ref 0.61–1.24)
GFR calc Af Amer: 17 mL/min — ABNORMAL LOW (ref >60)
GFR calc Af Amer: 18 mL/min — ABNORMAL LOW (ref >60)
GFR, EST NON AFRICAN AMERICAN: 14 mL/min — AB (ref >60)
GFR, EST NON AFRICAN AMERICAN: 15 mL/min — AB (ref >60)
Glucose, Bld: 133 mg/dL — ABNORMAL HIGH (ref 65–99)
Glucose, Bld: 137 mg/dL — ABNORMAL HIGH (ref 65–99)
POTASSIUM: 3.6 mmol/L (ref 3.5–5.1)
POTASSIUM: 3.6 mmol/L (ref 3.5–5.1)
SODIUM: 131 mmol/L — AB (ref 135–145)
SODIUM: 132 mmol/L — AB (ref 135–145)

## 2016-06-14 LAB — BPAM FFP
BLOOD PRODUCT EXPIRATION DATE: 201805092359
Blood Product Expiration Date: 201805082359
ISSUE DATE / TIME: 201805042240
ISSUE DATE / TIME: 201805050036
UNIT TYPE AND RH: 600
UNIT TYPE AND RH: 6200

## 2016-06-14 LAB — HEPATIC FUNCTION PANEL
ALBUMIN: 1.7 g/dL — AB (ref 3.5–5.0)
ALK PHOS: 72 U/L (ref 38–126)
ALT: 499 U/L — ABNORMAL HIGH (ref 17–63)
AST: 1269 U/L — ABNORMAL HIGH (ref 15–41)
BILIRUBIN DIRECT: 15.4 mg/dL — AB (ref 0.1–0.5)
BILIRUBIN TOTAL: 25.3 mg/dL — AB (ref 0.3–1.2)
Indirect Bilirubin: 9.9 mg/dL — ABNORMAL HIGH (ref 0.3–0.9)
Total Protein: 4.8 g/dL — ABNORMAL LOW (ref 6.5–8.1)

## 2016-06-14 LAB — CBC
HCT: 22.4 % — ABNORMAL LOW (ref 39.0–52.0)
HEMATOCRIT: 16.8 % — AB (ref 39.0–52.0)
HEMOGLOBIN: 7.9 g/dL — AB (ref 13.0–17.0)
Hemoglobin: 6 g/dL — CL (ref 13.0–17.0)
MCH: 33.7 pg (ref 26.0–34.0)
MCH: 32.2 pg (ref 26.0–34.0)
MCHC: 35.7 g/dL (ref 30.0–36.0)
MCHC: 35.3 g/dL (ref 30.0–36.0)
MCV: 94.4 fL (ref 78.0–100.0)
MCV: 91.4 fL (ref 78.0–100.0)
PLATELETS: 113 10*3/uL — AB (ref 150–400)
PLATELETS: 130 10*3/uL — AB (ref 150–400)
RBC: 1.78 MIL/uL — ABNORMAL LOW (ref 4.22–5.81)
RBC: 2.45 MIL/uL — AB (ref 4.22–5.81)
RDW: 22.6 % — AB (ref 11.5–15.5)
RDW: 21.2 % — ABNORMAL HIGH (ref 11.5–15.5)
WBC: 12.2 10*3/uL — AB (ref 4.0–10.5)
WBC: 14.1 10*3/uL — AB (ref 4.0–10.5)

## 2016-06-14 LAB — PROTIME-INR
INR: 2.65
PROTHROMBIN TIME: 28.8 s — AB (ref 11.4–15.2)

## 2016-06-14 MED ORDER — SODIUM BICARBONATE 8.4 % IV SOLN
INTRAVENOUS | Status: DC
  Administered 2016-06-14 – 2016-06-15 (×2): via INTRAVENOUS
  Filled 2016-06-14 (×3): qty 150

## 2016-06-14 MED ORDER — SODIUM CHLORIDE 0.9 % IV SOLN
Freq: Once | INTRAVENOUS | Status: AC
  Administered 2016-06-14: 10 mL/h via INTRAVENOUS

## 2016-06-14 MED ORDER — PANTOPRAZOLE SODIUM 40 MG IV SOLR
8.0000 mg/h | INTRAVENOUS | Status: AC
  Administered 2016-06-14 (×2): 8 mg/h via INTRAVENOUS
  Filled 2016-06-14 (×5): qty 80

## 2016-06-14 MED ORDER — SODIUM CHLORIDE 0.9 % IV SOLN
50.0000 ug/h | INTRAVENOUS | Status: DC
  Administered 2016-06-14 – 2016-06-20 (×14): 50 ug/h via INTRAVENOUS
  Filled 2016-06-14 (×30): qty 1

## 2016-06-14 MED ORDER — PANTOPRAZOLE SODIUM 40 MG IV SOLR
40.0000 mg | Freq: Two times a day (BID) | INTRAVENOUS | Status: DC
  Administered 2016-06-17 – 2016-06-18 (×3): 40 mg via INTRAVENOUS
  Filled 2016-06-14 (×3): qty 40

## 2016-06-14 NOTE — Progress Notes (Addendum)
CRITICAL VALUE ALERT  Critical value received:  Ca 6.0  Date of notification:  05/06  Time of notification:  0726  Critical value read back:Yes  Nurse who received alert:  Neysa Hotter  MD notified (1st page):  Dr. Clementeen Graham  Time of first page:  [phone]  MD notified (2nd page):  Time of second page:  Responding MD:  Dr. Clementeen Graham   Time MD responded:  [phone]

## 2016-06-14 NOTE — Progress Notes (Signed)
Spoke with staff RN regarding PICC placement today if possible.  Currently has 3 working PIV's.

## 2016-06-14 NOTE — Progress Notes (Signed)
[facility] Progress Note   Subjective: got up to bedside commode w/ assistance. Wobbly on his feet.  No new c/o.  3.8 L in and 750 ccUOP.  BP's stable mostly 956- 213 systolic range, a couple in the 90s.  LFT's rising, Tbili 25 and AST/ ALT worse today. WBC down however 12k  Vitals:   06/14/16 0800 06/14/16 1045 06/14/16 1125 06/14/16 1200  BP: 108/68 (!) 94/34 116/64 125/63  Pulse: 91 97 89 91  Resp: (!) 22  (!) 21 (!) 25  Temp:  97.8 F (36.6 C) 97.7 F (36.5 C)   TempSrc:  Oral Oral   SpO2: 100% 100% 100% 100%  Weight:      Height:        Inpatient medications: . folic acid  1 mg Oral Daily  . lactulose  20 g Oral TID  . LORazepam  0-4 mg Intravenous Q6H   Followed by  . LORazepam  0-4 mg Intravenous Q12H  . midodrine  10 mg Oral TID WC  . multivitamin with minerals  1 tablet Oral Daily  . [START ON 06/17/2016] pantoprazole  40 mg Intravenous Q12H  . prednisoLONE  40 mg Oral Daily  . rifaximin  550 mg Oral BID  . thiamine  100 mg Oral Daily   Or  . thiamine  100 mg Intravenous Daily   . albumin human 50 g (06/14/16 0754)  . cefTRIAXone (ROCEPHIN)  IV Stopped (06/12/16 1812)  . dextrose 5 % 1,000 mL with sodium bicarbonate 150 mEq infusion 100 mL/hr at 06/14/16 1317  . octreotide  (SANDOSTATIN)    IV infusion 50 mcg/hr (06/14/16 1125)  . pantoprozole (PROTONIX) infusion 8 mg/hr (06/14/16 1125)   LORazepam **OR** LORazepam  Exam: Gen marked jaundice, looks ill, awake but sluggish Sclera anicteric, throat clear No jvd or bruits Chest clear bilat to bases RRR no MRG Abd sig ascites, no hsm, +bs GU normal male Ext trace LE edema / no wounds or ulcers Neuro is alert, Ox 3 today  Home meds> lasix 40/d, ativan, PPI, prednisone, aldactone Renal US > 15 cm kidneys, no hydro UA > negative, amber UNa < 10,  Uosm 383  Assessment: 1. 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 No dialysis candidate given severe  comorbidities. Cont supp care, decrease IVF to 75 /hr.  UA neg and renal US normal.  2. Cirrhosis, decomp - MELD is 40, poor prognosis, LFT's worsening but WBC down 3. ^^WBC - on empiric Rocephin IV, WBC down 4. Hyponatremia - better w/ isootonic fluids 5. AMS - renal / hepatic enceph. Slightly better today 6. ETOH abuse  7. Hyperkalemia mild 8. Met acidosis - on bicarb gtt, improving  Plan - as above   Kelly Splinter MD [facility] pager [phone]   06/14/2016, 1:54 PM    Recent Labs Lab 06/12/16 2028 06/13/16 1849 06/14/16 0624  NA 126* 130* 131*  K 5.5* 5.1 3.6  CL 96* 100* 98*  CO2 9* 13* 16*  GLUCOSE 99 135* 133*  BUN 134* 153* 148*  CREATININE 4.77* 4.66* 4.53*  CALCIUM 7.2* 6.5* 6.0*    Recent Labs Lab 06/12/16 1813 06/12/16 2028 06/14/16 0624  AST 255* 332* 1,269*  ALT 93* 112* 499*  ALKPHOS 92 90 72  BILITOT 22.8* 21.5* 25.3*  PROT 5.2* 5.1* 4.8*  ALBUMIN 1.4* 1.3* 1.7*    Recent Labs Lab 06/12/16 1813 06/13/16 0517 06/14/16 0835  WBC 37.1* 27.1* 12.2*  HGB 7.2* 5.5* 6.0*  HCT 20.7* 16.0* 16.8*  MCV 103.0* 102.6* 94.4  PLT 386 221 113*   Iron/TIBC/Ferritin/ %Sat No results found for: IRON, TIBC, FERRITIN, IRONPCTSAT

## 2016-06-14 NOTE — Progress Notes (Signed)
Progress Note   Subjective  Girlfriend at bedside Patient denies pain Girlfriend thinks he is less confused Had 2 small dark stools overnight though reportedly less dark than before No nausea or vomiting    Objective  Vital signs in last 24 hours: Temp:  [97.6 F (36.4 C)-98.3 F (36.8 C)] 97.8 F (36.6 C) (05/06 1045) Pulse Rate:  [85-115] 97 (05/06 1045) Resp:  [17-24] 22 (05/06 0800) BP: (94-170)/(24-68) 94/34 (05/06 1045) SpO2:  [98 %-100 %] 100 % (05/06 0800) Weight:  [276 lb 0.3 oz (125.2 kg)] 276 lb 0.3 oz (125.2 kg) (05/06 0352) Last BM Date: 06/13/16 Gen: awake, deeply jaundiced and chronically ill-appearing  HEENT: Icteric, OP clear CV: RRR, no mrg Pulm: Decreased bilateral bases Abd: soft, nontender but distended with fluid though not tense, +BS throughout Ext: no c/c, 2+ LE edema Neuro: nonfocal, moderate asterixis  Intake/Output from previous day: 05/05 0701 - 05/06 0700 In: 3800.8 [I.V.:3030.8; Blood:670; IV Piggyback:100] Out: 730 [Urine:725; Blood:5] Intake/Output this shift: Total I/O In: 460 [P.O.:60; IV Piggyback:400] Out: 1 [Stool:1]  Lab Results:  Recent Labs  06/12/16 1813 06/13/16 0517 06/14/16 0835  WBC 37.1* 27.1* 12.2*  HGB 7.2* 5.5* 6.0*  HCT 20.7* 16.0* 16.8*  PLT 386 221 113*   BMET  Recent Labs  06/12/16 2028 06/13/16 1849 06/14/16 0624  NA 126* 130* 131*  K 5.5* 5.1 3.6  CL 96* 100* 98*  CO2 9* 13* 16*  GLUCOSE 99 135* 133*  BUN 134* 153* 148*  CREATININE 4.77* 4.66* 4.53*  CALCIUM 7.2* 6.5* 6.0*   LFT  Recent Labs  06/14/16 0624  PROT 4.8*  ALBUMIN 1.7*  AST 1,269*  ALT 499*  ALKPHOS 72  BILITOT 25.3*  BILIDIR 15.4*  IBILI 9.9*   Results for Colin Hodges, Colin Hodges (MRN [id number]) as of 06/14/2016 11:32  Ref. Range 06/12/2016 18:13 06/12/2016 20:28 06/13/2016 18:49 06/14/2016 06:24  Alkaline Phosphatase Latest Ref Range: 38 - 126 U/L 92 90  72  Albumin Latest Ref Range: 3.5 - 5.0 g/dL 1.4 (L) 1.3 (L)  1.7 (L)  AST  Latest Ref Range: 15 - 41 U/L 255 (H) 332 (H)  1,269 (H)  ALT Latest Ref Range: 17 - 63 U/L 93 (H) 112 (H)  499 (H)  Total Protein Latest Ref Range: 6.5 - 8.1 g/dL 5.2 (L) 5.1 (L)  4.8 (L)  Bilirubin, Direct Latest Ref Range: 0.1 - 0.5 mg/dL    15.4 (H)  Indirect Bilirubin Latest Ref Range: 0.3 - 0.9 mg/dL    9.9 (H)  Total Bilirubin Latest Ref Range: 0.3 - 1.2 mg/dL 22.8 (HH) 21.5 (HH)  25.3 (HH)    PT/INR  Recent Labs  06/12/16 1813 06/14/16 0624  LABPROT 30.5* 28.8*  INR 2.84 2.65   Hepatitis Panel No results for input(s): HEPBSAG, HCVAB, HEPAIGM, HEPBIGM in the last 72 hours.  Studies/Results: US Renal  Result Date: 06/13/2016 CLINICAL DATA:  Acute kidney injury EXAM: RENAL / URINARY TRACT ULTRASOUND COMPLETE COMPARISON:  None. FINDINGS: Right Kidney: Length: 15.5 cm. Echogenicity within normal limits. No mass or hydronephrosis visualized. Left Kidney: Length: 15.5 cm. Echogenicity within normal limits. No mass or hydronephrosis visualized. Bladder: Appears normal for degree of bladder distention. Peritoneal ascites is visible in the perihepatic space. IMPRESSION: Normal kidneys and bladder.  Peritoneal ascites noted. Electronically Signed   By: Andreas Newport M.D.   On: 06/13/2016 06:20      Assessment & Plan  46 year old male with severely decompensated alcoholic liver disease with  recent GI bleeding, worsening hepatic encephalopathy, acute kidney injury consistent with hepatorenal syndrome, ascites Problems below all relate to decompensated liver disease -- meld score around 40 which portends a very poor prognosis  1. Acute alcoholic hepatitis -- patient admits to recent alcohol use though much less significant than before. 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 Either way, severely decompensated disease --On prednisolone 40 mg daily --LFTs are higher today. Ray shows still that of alcoholic hepatitis but I suspect there is an element of  shock liver/hypoperfusion --Trend LFTs  2. GI bleeding -- unclear source after EGD yesterday. Hemoglobin remains low at 6.0. 2 additional units of packed cells have been ordered today. No varices seen in the esophagus or stomach though there was fresh blood in the duodenum from hemorrhagic duodenitis and scattered angiodysplastic lesions. Cannot exclude more distal small bowel varices. No overt bleeding this morning. --Continuing octreotide infusion --Packed red cell transfusions --Monitor hemoglobin  3. Hepatorenal syndrome -- severe. Renal team following. Creatinine down only slightly today. Continue midodrine, octreotide, and IV albumin. Packed red cells will also help with intravascular volume  4. Hepatic encephalopathy -- in the setting of GI bleed/decompensated liver disease. Continue lactulose and rifaximin  5. Ascites -- on empiric antibiotics due to problem #2. Holding diuretics due to problem #3  6. Leukocytosis -- improved dramatically with empiric antibiotics. Unclear source though will receive 5-7 days of antibiotics empirically unless documented infection changes this plan  7. Coagulopathy -- due to decompensated liver disease. Getting vitamin K, repeat FFP if overt bleeding  Dr. Benson Norway to resume care tomorrow    Principal Problem:   GI bleed Active Problems:   Alcohol abuse   Decompensated hepatic cirrhosis (HCC)   Hepatorenal syndrome (HCC)   Esophageal varices (HCC)   Acute blood loss anemia   Coagulopathy ([place])   Melena     LOS: 2 days   Daizha Anand M  06/14/2016, 11:27 AM

## 2016-06-14 NOTE — Progress Notes (Signed)
CRITICAL VALUE ALERT  Critical value received:  Calcium 6.3 Date of notification:  05/06  Time of notification:  5993  Critical value read back: Yes  Nurse who received alert:  Neysa Hotter  MD notified (1st page):  Dr. Clementeen Graham  Time of first page:  [phone]  MD notified (2nd page):  Time of second page:  Responding MD:    Time MD responded:

## 2016-06-14 NOTE — Progress Notes (Signed)
Notified the RN that PICC will no tbe completed today.  States PIV are working currently when pt keeps his arms straight.

## 2016-06-14 NOTE — Progress Notes (Addendum)
PROGRESS NOTE                                                                                                                                                                                                             Patient Demographics:    Colin Hodges, is a 46 y.o. male, DOB - Mar 24, 1970, JJO:[id number]  Admit date - 06/12/2016   Admitting Physician Carol Ada, MD  Outpatient Primary MD for the patient is Patient, No Pcp Per  LOS - 2  Outpatient Specialists:Dr. Benson Norway (GI)  No chief complaint on file.      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 He reported feeling weak and had some epigastric discomfort. His girlfriend at bedside reports him to be getting confused for last few days. Patient reports poor by mouth intake and having some hematemesis as well. Denies any fall, loss of consciousness, chest pain, shortness of breath, fevers, abdominal pain or distention. Denies any dysuria or diarrhea. Girlfriend reports that he has not been drinking since he got hospitalized in 2015. He called Dr. Benson Norway and was hostile, to the hospital. 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 His MELD score was 27 at that time and could not get a TIPS.  On admission he had significant leukocytosis (37K) with a hemoglobin of 7.2 which dropped to 5.5 this morning. Chemistry showed sodium of 129, K of 5.2, chloride 97, CO2 of 11, BUN of 133 and creatinine of 4.77 and INR of 2.84. He had an anion gap of 21. Patient placed on octreotide drip, IV Protonix. aHe also received 2 units FFP on admission along with vitamin K. This morning given his acute drop in H&H he was ordered for 2 units PRBC.     Subjective:   EGD  done yesterday showing nonbleeding portal hypertensive gastropathy and hemorrhagic daily neuropathy without active bleeding. Patient still confused off and on.   Assessment  & Plan :    Principal Problem:    Decompensated hepatic cirrhosis (HCC) With associated upper GI bleed and coagulopathy.. EGD shows nonbleeding portal hypertensive gastropathy, hemorrhagic duodenal petit without active bleeding. -Continue octreotide and PPI drip. Hemoglobin 6 this morning.  Order another 2 units PRBC (4 units total). Received 2 unit FFP and vitamin K on admission. Continue serial H&H monitoring. Has mild abdominal distention with possible ascites, nontender. On empiric Rocephin for SBP prophylaxis. MELD score >40. Transaminases worsening. Not a candidate for TIPSS or liver transplant ( last drink was about 6 months ago). Also has associated uremic versus hepatic encephalopathy. GI consult appreciated.   Overall prognosis means guarded. If renal function unimproved,  may need to pursue hospice.  Acute hepatitis Worsened LFTs today ( ALT>1200).. On oral prednisone daily.  Acute kidney injury with? Hepatorenal syndrome ([place]) He had normal renal function in 2015. No recent labs since then. Now presenting with significant acute kidney injury and metabolic acidosis. Renal ultrasound without medical renal disease or obstruction. Shows peritoneal ascites. About 750 mL urine output in past 24 hours. Renal consult appreciated. Now on HRS protocol with milrinone drip, altered diet and IV albumin. Also getting D5 with 3 amps of bicarbonate.  Hyponatremia Secondary to decompensated liver disease versus HRS. Monitor with hydration.  Hepatic/uremic encephalopathy. Continues to be encephalopathic. Continue rifaximin and lactulose.  Alcohol abuse Reports last drink to be about 6 months back. On CIWA.  Transaminitis Secondary to decompensated liver disease.  Monitor.  Hyperkalemia Resolved.  Leukocytosis Secondary to acute illness/SBP. Continue empiric Rocephin. Approved in a.m. labs.  Code Status : Full code  Family Communication  girlfriend at bedside (Patient has a brother and a sister but is not in talking terms with them)  Disposition Plan  : Pending hospital course. Prognosis is guarded  Barriers For Discharge : Active symptoms  Consults  :   [facility]  Procedures  :  Renal ultrasound EGD  DVT Prophylaxis  : SCDs/coagulopathy  Lab Results  Component Value Date   PLT 113 (L) 06/14/2016    Antibiotics  :    Anti-infectives    Start     Dose/Rate Route Frequency Ordered Stop   06/13/16 2200  rifaximin (XIFAXAN) tablet 550 mg     550 mg Oral 2 times daily 06/13/16 1738     06/12/16 1800  cefTRIAXone (ROCEPHIN) 1 g in dextrose 5 % 50 mL IVPB     1 g 100 mL/hr over 30 Minutes Intravenous Every 24 hours 06/12/16 1706          Objective:   Vitals:   06/14/16 0751 06/14/16 0800 06/14/16 1045 06/14/16 1125  BP: 99/62 108/68 (!) 94/34 116/64  Pulse: 87 91 97 89  Resp: 17 (!) 22  (!) 21  Temp: 98.1 F (36.7 C)  97.8 F (36.6 C) 97.7 F (36.5 C)  TempSrc: Oral  Oral Oral  SpO2: 100% 100% 100% 100%  Weight:      Height:        Wt Readings from Last 3 Encounters:  06/14/16 125.2 kg (276 lb 0.3 oz)  01/14/14 98.2 kg (216 lb 8 oz)  10/20/11 113.4 kg (250 lb)     Intake/Output Summary (Last 24 hours) at 06/14/16 1142 Last data filed at 06/14/16 1125  Gross per 24 hour  Intake          5378.34 ml  Output              581 ml  Net          4797.34 ml     Physical Exam  EXN:[id number] jaundiced, confused, appears fatigued HEENT: Pallor present, icteric, moist mucosa, supple neck Chest: clear b/l, no added sounds CVS: N S1&S2,  no murmurs, rubs or gallop GI: soft, dominant distention increased, nontender Musculoskeletal: warm, trace edema bilaterally CNS: AAOX2, flapping tremors    Data  Review:    CBC  Recent Labs Lab 06/12/16 1813 06/13/16 0517 06/14/16 0835  WBC 37.1* 27.1* 12.2*  HGB 7.2* 5.5* 6.0*  HCT 20.7* 16.0* 16.8*  PLT 386 221 113*  MCV 103.0* 102.6* 94.4  MCH 35.8* 35.3* 33.7  MCHC 34.8 34.4 35.7  RDW 18.6* 18.6* 22.6*    Chemistries   Recent Labs Lab 06/12/16 1813 06/12/16 2028 06/13/16 1849 06/14/16 0624  NA 129* 126* 130* 131*  K 5.2* 5.5* 5.1 3.6  CL 97* 96* 100* 98*  CO2 11* 9* 13* 16*  GLUCOSE 92 99 135* 133*  BUN 133* 134* 153* 148*  CREATININE 4.77* 4.77* 4.66* 4.53*  CALCIUM 7.6* 7.2* 6.5* 6.0*  AST 255* 332*  --  1,269*  ALT 93* 112*  --  499*  ALKPHOS 92 90  --  72  BILITOT 22.8* 21.5*  --  25.3*   ------------------------------------------------------------------------------------------------------------------ No results for input(s): CHOL, HDL, LDLCALC, TRIG, CHOLHDL, LDLDIRECT in the last 72 hours.  No results found for: HGBA1C ------------------------------------------------------------------------------------------------------------------ No results for input(s): TSH, T4TOTAL, T3FREE, THYROIDAB in the last 72 hours.  Invalid input(s): FREET3 ------------------------------------------------------------------------------------------------------------------ No results for input(s): VITAMINB12, FOLATE, FERRITIN, TIBC, IRON, RETICCTPCT in the last 72 hours.  Coagulation profile  Recent Labs Lab 06/12/16 1813 06/14/16 0624  INR 2.84 2.65    No results for input(s): DDIMER in the last 72 hours.  Cardiac Enzymes No results for input(s): CKMB, TROPONINI, MYOGLOBIN in the last 168 hours.  Invalid input(s): CK ------------------------------------------------------------------------------------------------------------------ No results found for: BNP  Inpatient Medications  Scheduled Meds: . folic acid  1 mg Oral Daily  . lactulose  20 g Oral TID  . LORazepam  0-4 mg Intravenous Q6H   Followed by  . LORazepam   0-4 mg Intravenous Q12H  . midodrine  10 mg Oral TID WC  . multivitamin with minerals  1 tablet Oral Daily  . [START ON 06/17/2016] pantoprazole  40 mg Intravenous Q12H  . prednisoLONE  40 mg Oral Daily  . rifaximin  550 mg Oral BID  . thiamine  100 mg Oral Daily   Or  . thiamine  100 mg Intravenous Daily   Continuous Infusions: . albumin human 50 g (06/14/16 0754)  . cefTRIAXone (ROCEPHIN)  IV Stopped (06/12/16 1812)  . dextrose 5 % 1,000 mL with sodium bicarbonate 150 mEq infusion 100 mL/hr at 06/14/16 1125  . octreotide  (SANDOSTATIN)    IV infusion 50 mcg/hr (06/14/16 1125)  . pantoprozole (PROTONIX) infusion 8 mg/hr (06/14/16 1125)   PRN Meds:.LORazepam **OR** LORazepam  Micro Results Recent Results (from the past 240 hour(s))  MRSA PCR Screening     Status: None   Collection Time: 06/12/16  5:12 PM  Result Value Ref Range Status   MRSA by PCR NEGATIVE NEGATIVE Final    Comment:        The GeneXpert MRSA Assay (FDA approved for NASAL specimens only), is one component of a comprehensive MRSA colonization surveillance program. It is not intended to diagnose MRSA infection nor to guide or monitor treatment for MRSA infections.     Radiology Reports US Abdomen Complete  Result Date: 06/02/2016 CLINICAL DATA:  Jaundice EXAM: ABDOMEN ULTRASOUND COMPLETE COMPARISON:  01/09/2014 FINDINGS: Gallbladder: Gallbladder is well distended with gallbladder wall thickening to 7.4 mm. Multiple calculi are noted. A negative sonographic Murphy's sign is  noted. Common bile duct: Diameter: 5.4 mm. Liver: Diffusely increased in echogenicity consistent with fatty infiltration. This is similar to that seen on the prior CT examination. The liver is also enlarged in size. Phasic flow is noted within the portal vein with a to and fro component. IVC: No abnormality visualized. Pancreas: Visualized portion unremarkable. Spleen: Splenomegaly is noted. Splenic volume is approximately 1,500 cubic cm.  Perisplenic varices are again noted. Right Kidney: Length: 14.3 cm. Echogenicity within normal limits. No mass or hydronephrosis visualized. Left Kidney: Length: 14.9 cm. Echogenicity within normal limits. No mass or hydronephrosis visualized. Abdominal aorta: No aneurysm visualized. Other findings: Mild ascites is noted. IMPRESSION: Gallbladder is well distended with evidence of cholelithiasis and gallbladder wall thickening. The wall thickening may be in part due to the underlying ascites. No sonographic Percell Miller sign is noted. Hepatosplenomegaly with diffuse fatty infiltration of the liver similar to that seen on prior CT examination. Perisplenic varices consistent with a degree of portal hypertension. Electronically Signed   By: Inez Catalina M.D.   On: 06/02/2016 10:05   US Renal  Result Date: 06/13/2016 CLINICAL DATA:  Acute kidney injury EXAM: RENAL / URINARY TRACT ULTRASOUND COMPLETE COMPARISON:  None. FINDINGS: Right Kidney: Length: 15.5 cm. Echogenicity within normal limits. No mass or hydronephrosis visualized. Left Kidney: Length: 15.5 cm. Echogenicity within normal limits. No mass or hydronephrosis visualized. Bladder: Appears normal for degree of bladder distention. Peritoneal ascites is visible in the perihepatic space. IMPRESSION: Normal kidneys and bladder.  Peritoneal ascites noted. Electronically Signed   By: Andreas Newport M.D.   On: 06/13/2016 06:20    Time Spent in minutes 35   Louellen Molder M.D on 06/14/2016 at 11:42 AM  Between 7am to 7pm - Pager - [phone]  After 7pm go to [email or web address] - password [facility]  [facility] -  Office  [phone]

## 2016-06-14 NOTE — Progress Notes (Signed)
CRITICAL VALUE ALERT  Critical value received:  Hemoglobin 6.0  Date of notification:  05/06  Time of notification:  0958  Critical value read back:Yes  Nurse who received alert:  Neysa Hotter  MD notified (1st page):  Dr. Clementeen Graham  Time of first page:  [phone]  MD notified (2nd page):  Time of second page:  Responding MD:    Time MD responded:

## 2016-06-15 ENCOUNTER — Encounter (HOSPITAL_COMMUNITY): Payer: Self-pay | Admitting: Internal Medicine

## 2016-06-15 DIAGNOSIS — E876 Hypokalemia: Secondary | ICD-10-CM

## 2016-06-15 DIAGNOSIS — R740 Nonspecific elevation of levels of transaminase and lactic acid dehydrogenase [LDH]: Secondary | ICD-10-CM

## 2016-06-15 LAB — TYPE AND SCREEN
ABO/RH(D): A POS
Antibody Screen: NEGATIVE
UNIT DIVISION: 0
Unit division: 0
Unit division: 0
Unit division: 0

## 2016-06-15 LAB — HEPATIC FUNCTION PANEL
ALT: 432 U/L — ABNORMAL HIGH (ref 17–63)
AST: 809 U/L — ABNORMAL HIGH (ref 15–41)
Albumin: 2.3 g/dL — ABNORMAL LOW (ref 3.5–5.0)
Alkaline Phosphatase: 71 U/L (ref 38–126)
BILIRUBIN DIRECT: 17.6 mg/dL — AB (ref 0.1–0.5)
BILIRUBIN INDIRECT: 10 mg/dL — AB (ref 0.3–0.9)
BILIRUBIN TOTAL: 27.6 mg/dL — AB (ref 0.3–1.2)
Total Protein: 5.3 g/dL — ABNORMAL LOW (ref 6.5–8.1)

## 2016-06-15 LAB — BASIC METABOLIC PANEL
ANION GAP: 13 (ref 5–15)
BUN: 117 mg/dL — AB (ref 6–20)
CALCIUM: 6.5 mg/dL — AB (ref 8.9–10.3)
CO2: 22 mmol/L (ref 22–32)
Chloride: 99 mmol/L — ABNORMAL LOW (ref 101–111)
Creatinine, Ser: 3.93 mg/dL — ABNORMAL HIGH (ref 0.61–1.24)
GFR calc Af Amer: 20 mL/min — ABNORMAL LOW (ref >60)
GFR, EST NON AFRICAN AMERICAN: 17 mL/min — AB (ref >60)
GLUCOSE: 153 mg/dL — AB (ref 65–99)
Potassium: 3.4 mmol/L — ABNORMAL LOW (ref 3.5–5.1)
Sodium: 134 mmol/L — ABNORMAL LOW (ref 135–145)

## 2016-06-15 LAB — PROTIME-INR
INR: 2.68
PROTHROMBIN TIME: 29.1 s — AB (ref 11.4–15.2)

## 2016-06-15 LAB — BPAM RBC
BLOOD PRODUCT EXPIRATION DATE: 201805112359
Blood Product Expiration Date: 201805102359
Blood Product Expiration Date: 201805112359
Blood Product Expiration Date: 201805122359
ISSUE DATE / TIME: 201805050833
ISSUE DATE / TIME: 201805051258
ISSUE DATE / TIME: 201805061053
ISSUE DATE / TIME: 201805061400
UNIT TYPE AND RH: 6200
Unit Type and Rh: 6200
Unit Type and Rh: 6200
Unit Type and Rh: 6200

## 2016-06-15 LAB — CBC
HEMATOCRIT: 20.6 % — AB (ref 39.0–52.0)
HEMATOCRIT: 20 % — AB (ref 39.0–52.0)
Hemoglobin: 7.4 g/dL — ABNORMAL LOW (ref 13.0–17.0)
Hemoglobin: 7.1 g/dL — ABNORMAL LOW (ref 13.0–17.0)
MCH: 32.6 pg (ref 26.0–34.0)
MCH: 32.6 pg (ref 26.0–34.0)
MCHC: 35.9 g/dL (ref 30.0–36.0)
MCHC: 35.5 g/dL (ref 30.0–36.0)
MCV: 90.7 fL (ref 78.0–100.0)
MCV: 91.7 fL (ref 78.0–100.0)
PLATELETS: 133 10*3/uL — AB (ref 150–400)
PLATELETS: 135 10*3/uL — AB (ref 150–400)
RBC: 2.27 MIL/uL — ABNORMAL LOW (ref 4.22–5.81)
RBC: 2.18 MIL/uL — ABNORMAL LOW (ref 4.22–5.81)
RDW: 21.8 % — AB (ref 11.5–15.5)
RDW: 22.2 % — AB (ref 11.5–15.5)
WBC: 13.8 10*3/uL — AB (ref 4.0–10.5)
WBC: 12.1 10*3/uL — AB (ref 4.0–10.5)

## 2016-06-15 LAB — PHOSPHORUS: PHOSPHORUS: 6.4 mg/dL — AB (ref 2.5–4.6)

## 2016-06-15 MED ORDER — ALBUMIN HUMAN 25 % IV SOLN
50.0000 g | Freq: Once | INTRAVENOUS | Status: AC
  Administered 2016-06-15: 50 g via INTRAVENOUS
  Filled 2016-06-15: qty 200

## 2016-06-15 MED ORDER — POTASSIUM CHLORIDE CRYS ER 20 MEQ PO TBCR
20.0000 meq | EXTENDED_RELEASE_TABLET | Freq: Once | ORAL | Status: AC
  Administered 2016-06-15: 20 meq via ORAL
  Filled 2016-06-15: qty 1

## 2016-06-15 MED ORDER — SODIUM CHLORIDE 0.9% FLUSH
10.0000 mL | INTRAVENOUS | Status: DC | PRN
  Administered 2016-06-20: 10 mL
  Filled 2016-06-15: qty 40

## 2016-06-15 NOTE — Progress Notes (Signed)
S: Hungry and wants to eat O:BP 121/65 (BP Location: Right Arm)   Pulse 80   Temp 97.9 F (36.6 C) (Oral)   Resp (!) 25   Ht 5' 10"  (1.778 m)   Wt 112.9 kg (248 lb 14.4 oz)   SpO2 99%   BMI 35.71 kg/m   Intake/Output Summary (Last 24 hours) at 06/15/16 0825 Last data filed at 06/15/16 8102  Gross per 24 hour  Intake          4903.34 ml  Output              600 ml  Net          4303.34 ml   Weight change: -12.3 kg (-27 lb 1.9 oz) VGC:YOYOO and alert.  + jaundice and scleralicterus CVS:RRR Resp:Clear Abd:+ BS NT + distention, soft JZB:FMZUA edema NEURO:CNI Ox3 no asterixis   . folic acid  1 mg Oral Daily  . lactulose  20 g Oral TID  . LORazepam  0-4 mg Intravenous Q12H  . midodrine  10 mg Oral TID WC  . multivitamin with minerals  1 tablet Oral Daily  . [START ON 06/17/2016] pantoprazole  40 mg Intravenous Q12H  . prednisoLONE  40 mg Oral Daily  . rifaximin  550 mg Oral BID  . thiamine  100 mg Oral Daily   Or  . thiamine  100 mg Intravenous Daily   No results found. BMET    Component Value Date/Time   NA 134 (L) 06/15/2016 0529   K 3.4 (L) 06/15/2016 0529   CL 99 (L) 06/15/2016 0529   CO2 22 06/15/2016 0529   GLUCOSE 153 (H) 06/15/2016 0529   BUN 117 (H) 06/15/2016 0529   CREATININE 3.93 (H) 06/15/2016 0529   CALCIUM 6.5 (L) 06/15/2016 0529   GFRNONAA 17 (L) 06/15/2016 0529   GFRAA 20 (L) 06/15/2016 0529   CBC    Component Value Date/Time   WBC 13.8 (H) 06/15/2016 0529   RBC 2.27 (L) 06/15/2016 0529   HGB 7.4 (L) 06/15/2016 0529   HCT 20.6 (L) 06/15/2016 0529   PLT 133 (L) 06/15/2016 0529   MCV 90.7 06/15/2016 0529   MCH 32.6 06/15/2016 0529   MCHC 35.9 06/15/2016 0529   RDW 21.8 (H) 06/15/2016 0529   LYMPHSABS 1.3 01/08/2014 2029   MONOABS 0.8 01/08/2014 2029   EOSABS 0 01/08/2014 2029   BASOSABS 0 01/08/2014 2029     Assessment:  1. Acute Renal failure most likely from HRS.  UO incomplete, Scr sl lower 2. ETOH cirrhosis 3. Mild  Hypokalemia 4. Met acidosis, on bicarb gtt, improving 5. Probable upper GI bleed  Plan: 1. Decrease rate of IV bicarb to 40cc/hr 2. Daily labs 3. Replace K 4. Agree with Dr Jonnie Finner that HD is not an option 5. Don't think he needs BID BMET   Cindy Brindisi T

## 2016-06-15 NOTE — Progress Notes (Signed)
Subjective: Difficult night with all the alarms from the IVs, etc.  Objective: Vital signs in last 24 hours: Temp:  [97.5 F (36.4 C)-98.1 F (36.7 C)] 97.9 F (36.6 C) (05/07 0749) Pulse Rate:  [74-97] 86 (05/07 0749) Resp:  [14-25] 17 (05/07 0749) BP: (94-147)/(34-87) 121/65 (05/07 0749) SpO2:  [99 %-100 %] 99 % (05/07 0749) Weight:  [112.9 kg (248 lb 14.4 oz)] 112.9 kg (248 lb 14.4 oz) (05/07 0252) Last BM Date: 06/14/16  Intake/Output from previous day: 05/06 0701 - 05/07 0700 In: 4977.1 [P.O.:900; I.V.:2762.1; Blood:665; IV Piggyback:650] Out: 600 [Urine:600] Intake/Output this shift: No intake/output data recorded.  General appearance: alert and no distress Resp: clear to auscultation bilaterally Cardio: regular rate and rhythm GI: soft, non-tender; bowel sounds normal; no masses,  no organomegaly Extremities: slight asterixis  Lab Results:  Recent Labs  06/14/16 0835 06/14/16 1729 06/15/16 0529  WBC 12.2* 14.1* 13.8*  HGB 6.0* 7.9* 7.4*  HCT 16.8* 22.4* 20.6*  PLT 113* 130* 133*   BMET  Recent Labs  06/14/16 0624 06/14/16 1729 06/15/16 0529  NA 131* 132* 134*  K 3.6 3.6 3.4*  CL 98* 99* 99*  CO2 16* 18* 22  GLUCOSE 133* 137* 153*  BUN 148* 133* 117*  CREATININE 4.53* 4.28* 3.93*  CALCIUM 6.0* 6.3* 6.5*   LFT  Recent Labs  06/15/16 0529  PROT 5.3*  ALBUMIN 2.3*  AST 809*  ALT 432*  ALKPHOS 71  BILITOT 27.6*  BILIDIR 17.6*  IBILI 10.0*   PT/INR  Recent Labs  06/14/16 0624 06/15/16 0529  LABPROT 28.8* 29.1*  INR 2.65 2.68   Hepatitis Panel No results for input(s): HEPBSAG, HCVAB, HEPAIGM, HEPBIGM in the last 72 hours. C-Diff No results for input(s): CDIFFTOX in the last 72 hours. Fecal Lactopherrin No results for input(s): FECLLACTOFRN in the last 72 hours.  Studies/Results: No results found.  Medications:  Scheduled: . folic acid  1 mg Oral Daily  . lactulose  20 g Oral TID  . LORazepam  0-4 mg Intravenous Q12H  .  midodrine  10 mg Oral TID WC  . multivitamin with minerals  1 tablet Oral Daily  . [START ON 06/17/2016] pantoprazole  40 mg Intravenous Q12H  . prednisoLONE  40 mg Oral Daily  . rifaximin  550 mg Oral BID  . thiamine  100 mg Oral Daily   Or  . thiamine  100 mg Intravenous Daily   Continuous: . albumin human 50 g (06/14/16 0754)  . cefTRIAXone (ROCEPHIN)  IV 1 g (06/14/16 1800)  . octreotide  (SANDOSTATIN)    IV infusion 50 mcg/hr (06/15/16 0303)  .  sodium bicarbonate  infusion 1000 mL 75 mL/hr at 06/14/16 2215    Assessment/Plan: 1) ETOH hepatitis. 2) Decompensated cirrhosis. 3) Shock liver from phenylephrine during EGD. 4) Coagulopathy. 5) Renal failure - HRS. 6) Upper GI bleed - unclear source.   I had a long discussion with Lennette Bihari.  I told him that all the interventions are supportive and the rest is up to his liver to stabilize/heal.  His creatinine is improving and he is making urine.  His shock liver is also improving.  Currently he is being treated as an ETOH hepatitis and hopefully he will respond to treatment.  No evidence of any withdrawal.    Plan: 1) Continue with the current supportive care as outline previously.  LOS: 3 days   Kiana Hollar D 06/15/2016, 7:55 AM

## 2016-06-15 NOTE — Progress Notes (Deleted)
PROGRESS NOTE                                                                                                                                                                                                             Patient Demographics:    Colin Hodges, is a 46 y.o. male, DOB - [birth year], VHQ:[id number]  Admit date - 06/12/2016   Admitting Physician Carol Ada, MD  Outpatient Primary MD for the patient is Patient, No Pcp Per  LOS - 3  Outpatient Specialists:Dr. Benson Norway (GI)  No chief complaint on file.      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 He reported feeling weak and had some epigastric discomfort. His girlfriend at bedside reports him to be getting confused for last few days. Patient reports poor by mouth intake and having some hematemesis as well. Denies any fall, loss of consciousness, chest pain, shortness of breath, fevers, abdominal pain or distention. Denies any dysuria or diarrhea. Girlfriend reports that he has not been drinking since he got hospitalized in 2015. He called Dr. Benson Norway and was hostile, to the hospital. 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 His MELD score was 27 at that time and could not get a TIPS.  On admission he had significant leukocytosis (37K) with a hemoglobin of 7.2 which dropped to 5.5 this morning. Chemistry showed sodium of 129, K of 5.2, chloride 97, CO2 of 11, BUN of 133 and creatinine of 4.77 and INR of 2.84. He had an anion gap of 21. Patient placed on octreotide drip, IV Protonix. aHe also received 2 units FFP on admission along with vitamin K.      Subjective:   Patient appears alert and oriented today. Denies any pain but is frustrated  being hooked up with a lot of IV medications.   Assessment  & Plan :    Principal Problem:    Decompensated hepatic cirrhosis (HCC) With associated upper GI bleed and coagulopathy.. EGD shows nonbleeding portal hypertensive gastropathy, hemorrhagic duodenopathy without active bleeding. -On octreotide and PPI drip. Received 4 units PRBC, hemoglobin currently stable..  Received 2 unit FFP, vitamin K daily 3 days - serial  H&H monitoring. -empiric Rocephin for SBP prophylaxis. MELD score >40. Significant transaminitis possibly due to acute hepatitis/shock liver. Not a candidate for TIPSS or liver transplant ( last drink was about 6 months ago). - encephalopathy improved.  GI following closely.     Acute alcoholic hepatitis Continue oral prednisone. LFTs slightly better from yesterday.  Acute kidney injury with? Hepatorenal syndrome ([place]) He had normal renal function in 2015. No recent labs since then. Now presenting with significant acute kidney injury and metabolic acidosis. Renal ultrasound without medical renal disease or obstruction. -Has good urine output. -Continue midodrine drip,  IV albumin. Also getting D5 with 3 amps of bicarbonate. (Bicarbonate improved, related to fluid reduced). Renal consult appreciated. Monitor daily labs.  Hyponatremia Secondary to decompensated liver disease versus HRS. improving with hydration.  Hepatic/uremic encephalopathy. Improved today. Continue rifaximin and lactulose.  Alcohol abuse Reports last drink to be about 6 months back. On CIWA.  Transaminitis Secondary to decompensated liver disease/shock liver. Monitor daily.  Hyperkalemia Resolved.  Leukocytosis Secondary to acute illness/SBP. Continue empiric Rocephin. Improving.  Code Status : Full code  Family Communication  none at bedside today (Patient has a brother and a sister but is not in talking terms with them)  Disposition Plan  : Pending hospital course. Prognosis is  guarded. Continue step down monitoring.  Barriers For Discharge : Active symptoms  Consults  :   [facility]  Procedures  :  Renal ultrasound EGD  DVT Prophylaxis  : SCDs/coagulopathy  Lab Results  Component Value Date   PLT 133 (L) 06/15/2016    Antibiotics  :    Anti-infectives    Start     Dose/Rate Route Frequency Ordered Stop   06/13/16 2200  rifaximin (XIFAXAN) tablet 550 mg     550 mg Oral 2 times daily 06/13/16 1738     06/12/16 1800  cefTRIAXone (ROCEPHIN) 1 g in dextrose 5 % 50 mL IVPB     1 g 100 mL/hr over 30 Minutes Intravenous Every 24 hours 06/12/16 1706          Objective:   Vitals:   06/14/16 2318 06/15/16 0252 06/15/16 0749 06/15/16 0819  BP: 122/79 128/67 121/65   Pulse: 74 81 86 80  Resp: (!) 21 (!) 21 17 (!) 25  Temp: 98.1 F (36.7 C) 98 F (36.7 C) 97.9 F (36.6 C)   TempSrc: Oral Oral Oral   SpO2: 100% 100% 99% 99%  Weight:  112.9 kg (248 lb 14.4 oz)    Height:        Wt Readings from Last 3 Encounters:  06/15/16 112.9 kg (248 lb 14.4 oz)  01/14/14 98.2 kg (216 lb 8 oz)  10/20/11 113.4 kg (250 lb)     Intake/Output Summary (Last 24 hours) at 06/15/16 0947 Last data filed at 06/15/16 6237  Gross per 24 hour  Intake          4903.34 ml  Output              600 ml  Net          4303.34 ml     Physical Exam  SEG:[id number] jaundiced, Oriented, not in distress HEENT:  icteric, moist mucosa, supple neck Chest: clear b/l, no added sounds CVS: N S1&S2, no murmurs GI: soft, distended, nontender Musculoskeletal: warm, trace edema bilaterally CNS: Alert and oriented, minimal tremors    Data Review:    CBC  Recent Labs Lab 06/12/16 1813 06/13/16 0517 06/14/16 0737  06/14/16 1729 06/15/16 0529  WBC 37.1* 27.1* 12.2* 14.1* 13.8*  HGB 7.2* 5.5* 6.0* 7.9* 7.4*  HCT 20.7* 16.0* 16.8* 22.4* 20.6*  PLT 386 221 113* 130* 133*  MCV 103.0* 102.6* 94.4 91.4 90.7  MCH 35.8* 35.3* 33.7 32.2 32.6  MCHC 34.8 34.4 35.7  35.3 35.9  RDW 18.6* 18.6* 22.6* 21.2* 21.8*    Chemistries   Recent Labs Lab 06/12/16 1813 06/12/16 2028 06/13/16 1849 06/14/16 0624 06/14/16 1729 06/15/16 0529  NA 129* 126* 130* 131* 132* 134*  K 5.2* 5.5* 5.1 3.6 3.6 3.4*  CL 97* 96* 100* 98* 99* 99*  CO2 11* 9* 13* 16* 18* 22  GLUCOSE 92 99 135* 133* 137* 153*  BUN 133* 134* 153* 148* 133* 117*  CREATININE 4.77* 4.77* 4.66* 4.53* 4.28* 3.93*  CALCIUM 7.6* 7.2* 6.5* 6.0* 6.3* 6.5*  AST 255* 332*  --  1,269*  --  809*  ALT 93* 112*  --  499*  --  432*  ALKPHOS 92 90  --  72  --  71  BILITOT 22.8* 21.5*  --  25.3*  --  27.6*   ------------------------------------------------------------------------------------------------------------------ No results for input(s): CHOL, HDL, LDLCALC, TRIG, CHOLHDL, LDLDIRECT in the last 72 hours.  No results found for: HGBA1C ------------------------------------------------------------------------------------------------------------------ No results for input(s): TSH, T4TOTAL, T3FREE, THYROIDAB in the last 72 hours.  Invalid input(s): FREET3 ------------------------------------------------------------------------------------------------------------------ No results for input(s): VITAMINB12, FOLATE, FERRITIN, TIBC, IRON, RETICCTPCT in the last 72 hours.  Coagulation profile  Recent Labs Lab 06/12/16 1813 06/14/16 0624 06/15/16 0529  INR 2.84 2.65 2.68    No results for input(s): DDIMER in the last 72 hours.  Cardiac Enzymes No results for input(s): CKMB, TROPONINI, MYOGLOBIN in the last 168 hours.  Invalid input(s): CK ------------------------------------------------------------------------------------------------------------------ No results found for: BNP  Inpatient Medications  Scheduled Meds: . folic acid  1 mg Oral Daily  . lactulose  20 g Oral TID  . LORazepam  0-4 mg Intravenous Q12H  . midodrine  10 mg Oral TID WC  . multivitamin with minerals  1 tablet Oral  Daily  . [START ON 06/17/2016] pantoprazole  40 mg Intravenous Q12H  . potassium chloride  20 mEq Oral Once  . prednisoLONE  40 mg Oral Daily  . rifaximin  550 mg Oral BID  . thiamine  100 mg Oral Daily   Or  . thiamine  100 mg Intravenous Daily   Continuous Infusions: . albumin human 50 g (06/14/16 0754)  . cefTRIAXone (ROCEPHIN)  IV 1 g (06/14/16 1800)  . octreotide  (SANDOSTATIN)    IV infusion 50 mcg/hr (06/15/16 0303)  .  sodium bicarbonate  infusion 1000 mL 75 mL/hr at 06/14/16 2215   PRN Meds:.LORazepam **OR** LORazepam  Micro Results Recent Results (from the past 240 hour(s))  MRSA PCR Screening     Status: None   Collection Time: 06/12/16  5:12 PM  Result Value Ref Range Status   MRSA by PCR NEGATIVE NEGATIVE Final    Comment:        The GeneXpert MRSA Assay (FDA approved for NASAL specimens only), is one component of a comprehensive MRSA colonization surveillance program. It is not intended to diagnose MRSA infection nor to guide or monitor treatment for MRSA infections.     Radiology Reports US Abdomen Complete  Result Date: 06/02/2016 CLINICAL DATA:  Jaundice EXAM: ABDOMEN ULTRASOUND COMPLETE COMPARISON:  01/09/2014 FINDINGS: Gallbladder: Gallbladder is well distended with gallbladder wall thickening to 7.4 mm. Multiple calculi are noted. A  negative sonographic Murphy's sign is noted. Common bile duct: Diameter: 5.4 mm. Liver: Diffusely increased in echogenicity consistent with fatty infiltration. This is similar to that seen on the prior CT examination. The liver is also enlarged in size. Phasic flow is noted within the portal vein with a to and fro component. IVC: No abnormality visualized. Pancreas: Visualized portion unremarkable. Spleen: Splenomegaly is noted. Splenic volume is approximately 1,500 cubic cm. Perisplenic varices are again noted. Right Kidney: Length: 14.3 cm. Echogenicity within normal limits. No mass or hydronephrosis visualized. Left Kidney:  Length: 14.9 cm. Echogenicity within normal limits. No mass or hydronephrosis visualized. Abdominal aorta: No aneurysm visualized. Other findings: Mild ascites is noted. IMPRESSION: Gallbladder is well distended with evidence of cholelithiasis and gallbladder wall thickening. The wall thickening may be in part due to the underlying ascites. No sonographic Percell Miller sign is noted. Hepatosplenomegaly with diffuse fatty infiltration of the liver similar to that seen on prior CT examination. Perisplenic varices consistent with a degree of portal hypertension. Electronically Signed   By: Inez Catalina M.D.   On: 06/02/2016 10:05   US Renal  Result Date: 06/13/2016 CLINICAL DATA:  Acute kidney injury EXAM: RENAL / URINARY TRACT ULTRASOUND COMPLETE COMPARISON:  None. FINDINGS: Right Kidney: Length: 15.5 cm. Echogenicity within normal limits. No mass or hydronephrosis visualized. Left Kidney: Length: 15.5 cm. Echogenicity within normal limits. No mass or hydronephrosis visualized. Bladder: Appears normal for degree of bladder distention. Peritoneal ascites is visible in the perihepatic space. IMPRESSION: Normal kidneys and bladder.  Peritoneal ascites noted. Electronically Signed   By: Andreas Newport M.D.   On: 06/13/2016 06:20    Time Spent in minutes 35   Louellen Molder M.D on 06/15/2016 at 9:47 AM  Between 7am to 7pm - Pager - [phone]  After 7pm go to [email or web address] - password [facility]  [facility] -  Office  [phone]

## 2016-06-15 NOTE — Progress Notes (Signed)
PROGRESS NOTE                                                                                                                                                                                                             Patient Demographics:    Colin Hodges, is a 46 y.o. male, DOB - 01-27-1971, FHL:[id number]  Admit date - 06/12/2016   Admitting Physician Carol Ada, MD  Outpatient Primary MD for the patient is Patient, No Pcp Per  LOS - 3  Outpatient Specialists:Dr. Benson Norway (GI)  No chief complaint on file.      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 He reported feeling weak and had some epigastric discomfort. His girlfriend at bedside reports him to be getting confused for last few days. Patient reports poor by mouth intake and having some hematemesis as well. Denies any fall, loss of consciousness, chest pain, shortness of breath, fevers, abdominal pain or distention. Denies any dysuria or diarrhea. Girlfriend reports that he has not been drinking since he got hospitalized in 2015. He called Dr. Benson Norway and was hostile, to the hospital. 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 His MELD score was 27 at that time and could not get a TIPS.  On admission he had significant leukocytosis (37K) with a hemoglobin of 7.2 which dropped to 5.5 this morning. Chemistry showed sodium of 129, K of 5.2, chloride 97, CO2 of 11, BUN of 133 and creatinine of 4.77 and INR of 2.84. He had an anion gap of 21. Patient placed on octreotide drip, IV Protonix. aHe also received 2 units FFP on admission along with vitamin K.      Subjective:   Patient appears alert and oriented today. Denies any pain but is frustrated  being hooked up with a lot of IV medications.   Assessment  & Plan :    Principal Problem:    Decompensated hepatic cirrhosis (HCC) With associated upper GI bleed and coagulopathy.. EGD shows nonbleeding portal hypertensive gastropathy, hemorrhagic duodenopathy without active bleeding. -On octreotide and PPI drip. Received 4 units PRBC, hemoglobin currently stable..  Received 2 unit FFP, vitamin K daily 3 days - serial  H&H monitoring. -empiric Rocephin for SBP prophylaxis. MELD score >40. Significant transaminitis possibly due to acute hepatitis/shock liver. Not a candidate for TIPSS or liver transplant ( last drink was about 6 months ago). - encephalopathy improved.  GI following closely.   Acute upper GI bleed Secondary to findings as above. Received total 4 unit PRBC, hemoglobin currently stable.  Acute Alcoholic hepatitis Continue oral prednisone. LFTs Slightly better today.  Acute kidney injury with? Hepatorenal syndrome ([place]) He had normal renal function in 2015. No recent labs since then. Now presenting with significant acute kidney injury and metabolic acidosis. Renal ultrasound without medical renal disease or obstruction. Has decent urine output. Renal consult appreciated. Now on HRS protocol with milrinone and IV albumin. Also getting D5 with 3 amps of bicarbonate. (Rate lowered today). Monitor daily labs.  Hyponatremia Secondary to decompensated liver disease versus HRS. improving.  Hepatic/uremic encephalopathy. Improved today. Continue rifaximin and lactulose.  Alcohol abuse Reports last drink to be about 6 months back. On CIWA.  Transaminitis Secondary to decompensated liver disease/shock liver. Monitor daily.  Hyperkalemia Resolved. Now being replenished for low potassium.  Leukocytosis Secondary to acute illness/SBP. Continue empiric Rocephin. Improved.  Code Status : Full code  Family Communication  none at bedside (girlfriend involved in  care) (Patient has a brother and a sister but is not in talking terms with them)  Disposition Plan  : Pending hospital course. Prognosis is guarded  Barriers For Discharge : Active symptoms  Consults  :   [facility]  Procedures  :  Renal ultrasound EGD  DVT Prophylaxis  : SCDs/coagulopathy  Lab Results  Component Value Date   PLT 133 (L) 06/15/2016    Antibiotics  :    Anti-infectives    Start     Dose/Rate Route Frequency Ordered Stop   06/13/16 2200  rifaximin (XIFAXAN) tablet 550 mg     550 mg Oral 2 times daily 06/13/16 1738     06/12/16 1800  cefTRIAXone (ROCEPHIN) 1 g in dextrose 5 % 50 mL IVPB     1 g 100 mL/hr over 30 Minutes Intravenous Every 24 hours 06/12/16 1706          Objective:   Vitals:   06/14/16 2318 06/15/16 0252 06/15/16 0749 06/15/16 0819  BP: 122/79 128/67 121/65   Pulse: 74 81 86 80  Resp: (!) 21 (!) 21 17 (!) 25  Temp: 98.1 F (36.7 C) 98 F (36.7 C) 97.9 F (36.6 C)   TempSrc: Oral Oral Oral   SpO2: 100% 100% 99% 99%  Weight:  112.9 kg (248 lb 14.4 oz)    Height:        Wt Readings from Last 3 Encounters:  06/15/16 112.9 kg (248 lb 14.4 oz)  01/14/14 98.2 kg (216 lb 8 oz)  10/20/11 113.4 kg (250 lb)     Intake/Output Summary (Last 24 hours) at 06/15/16 0947 Last data filed at 06/15/16 0488  Gross per 24 hour  Intake          4903.34 ml  Output              600 ml  Net          4303.34 ml     Physical Exam  [id number] jaundiced,  appears fatigued HEENT:  icteric, moist mucosa, supple neck Chest: clear b/l, no added sounds CVS: N S1&S2, no murmurs, rubs or gallop GI: soft, Distended abdomen, nontender Musculoskeletal: warm, trace edema bilaterally CNS: Alert and oriented,  mild flapping tremors    Data Review:    CBC  Recent Labs Lab 06/12/16 1813 06/13/16 0517 06/14/16 0835 06/14/16 1729 06/15/16 0529  WBC 37.1* 27.1* 12.2* 14.1* 13.8*  HGB 7.2* 5.5* 6.0* 7.9* 7.4*  HCT 20.7* 16.0* 16.8*  22.4* 20.6*  PLT 386 221 113* 130* 133*  MCV 103.0* 102.6* 94.4 91.4 90.7  MCH 35.8* 35.3* 33.7 32.2 32.6  MCHC 34.8 34.4 35.7 35.3 35.9  RDW 18.6* 18.6* 22.6* 21.2* 21.8*    Chemistries   Recent Labs Lab 06/12/16 1813 06/12/16 2028 06/13/16 1849 06/14/16 0624 06/14/16 1729 06/15/16 0529  NA 129* 126* 130* 131* 132* 134*  K 5.2* 5.5* 5.1 3.6 3.6 3.4*  CL 97* 96* 100* 98* 99* 99*  CO2 11* 9* 13* 16* 18* 22  GLUCOSE 92 99 135* 133* 137* 153*  BUN 133* 134* 153* 148* 133* 117*  CREATININE 4.77* 4.77* 4.66* 4.53* 4.28* 3.93*  CALCIUM 7.6* 7.2* 6.5* 6.0* 6.3* 6.5*  AST 255* 332*  --  1,269*  --  809*  ALT 93* 112*  --  499*  --  432*  ALKPHOS 92 90  --  72  --  71  BILITOT 22.8* 21.5*  --  25.3*  --  27.6*   ------------------------------------------------------------------------------------------------------------------ No results for input(s): CHOL, HDL, LDLCALC, TRIG, CHOLHDL, LDLDIRECT in the last 72 hours.  No results found for: HGBA1C ------------------------------------------------------------------------------------------------------------------ No results for input(s): TSH, T4TOTAL, T3FREE, THYROIDAB in the last 72 hours.  Invalid input(s): FREET3 ------------------------------------------------------------------------------------------------------------------ No results for input(s): VITAMINB12, FOLATE, FERRITIN, TIBC, IRON, RETICCTPCT in the last 72 hours.  Coagulation profile  Recent Labs Lab 06/12/16 1813 06/14/16 0624 06/15/16 0529  INR 2.84 2.65 2.68    No results for input(s): DDIMER in the last 72 hours.  Cardiac Enzymes No results for input(s): CKMB, TROPONINI, MYOGLOBIN in the last 168 hours.  Invalid input(s): CK ------------------------------------------------------------------------------------------------------------------ No results found for: BNP  Inpatient Medications  Scheduled Meds: . folic acid  1 mg Oral Daily  . lactulose  20  g Oral TID  . LORazepam  0-4 mg Intravenous Q12H  . midodrine  10 mg Oral TID WC  . multivitamin with minerals  1 tablet Oral Daily  . [START ON 06/17/2016] pantoprazole  40 mg Intravenous Q12H  . potassium chloride  20 mEq Oral Once  . prednisoLONE  40 mg Oral Daily  . rifaximin  550 mg Oral BID  . thiamine  100 mg Oral Daily   Or  . thiamine  100 mg Intravenous Daily   Continuous Infusions: . albumin human 50 g (06/14/16 0754)  . cefTRIAXone (ROCEPHIN)  IV 1 g (06/14/16 1800)  . octreotide  (SANDOSTATIN)    IV infusion 50 mcg/hr (06/15/16 0303)  .  sodium bicarbonate  infusion 1000 mL 75 mL/hr at 06/14/16 2215   PRN Meds:.LORazepam **OR** LORazepam  Micro Results Recent Results (from the past 240 hour(s))  MRSA PCR Screening     Status: None   Collection Time: 06/12/16  5:12 PM  Result Value Ref Range Status   MRSA by PCR NEGATIVE NEGATIVE Final    Comment:        The GeneXpert MRSA Assay (FDA approved for NASAL specimens only), is one component of a comprehensive MRSA colonization surveillance program. It is not intended to diagnose MRSA infection nor to guide or monitor treatment for MRSA infections.     Radiology Reports US Abdomen Complete  Result Date: 06/02/2016 CLINICAL DATA:  Jaundice EXAM: ABDOMEN ULTRASOUND  COMPLETE COMPARISON:  01/09/2014 FINDINGS: Gallbladder: Gallbladder is well distended with gallbladder wall thickening to 7.4 mm. Multiple calculi are noted. A negative sonographic Murphy's sign is noted. Common bile duct: Diameter: 5.4 mm. Liver: Diffusely increased in echogenicity consistent with fatty infiltration. This is similar to that seen on the prior CT examination. The liver is also enlarged in size. Phasic flow is noted within the portal vein with a to and fro component. IVC: No abnormality visualized. Pancreas: Visualized portion unremarkable. Spleen: Splenomegaly is noted. Splenic volume is approximately 1,500 cubic cm. Perisplenic varices are again  noted. Right Kidney: Length: 14.3 cm. Echogenicity within normal limits. No mass or hydronephrosis visualized. Left Kidney: Length: 14.9 cm. Echogenicity within normal limits. No mass or hydronephrosis visualized. Abdominal aorta: No aneurysm visualized. Other findings: Mild ascites is noted. IMPRESSION: Gallbladder is well distended with evidence of cholelithiasis and gallbladder wall thickening. The wall thickening may be in part due to the underlying ascites. No sonographic Percell Miller sign is noted. Hepatosplenomegaly with diffuse fatty infiltration of the liver similar to that seen on prior CT examination. Perisplenic varices consistent with a degree of portal hypertension. Electronically Signed   By: Inez Catalina M.D.   On: 06/02/2016 10:05   US Renal  Result Date: 06/13/2016 CLINICAL DATA:  Acute kidney injury EXAM: RENAL / URINARY TRACT ULTRASOUND COMPLETE COMPARISON:  None. FINDINGS: Right Kidney: Length: 15.5 cm. Echogenicity within normal limits. No mass or hydronephrosis visualized. Left Kidney: Length: 15.5 cm. Echogenicity within normal limits. No mass or hydronephrosis visualized. Bladder: Appears normal for degree of bladder distention. Peritoneal ascites is visible in the perihepatic space. IMPRESSION: Normal kidneys and bladder.  Peritoneal ascites noted. Electronically Signed   By: Andreas Newport M.D.   On: 06/13/2016 06:20    Time Spent in minutes 35   Louellen Molder M.D on 06/15/2016 at 9:47 AM  Between 7am to 7pm - Pager - [phone]  After 7pm go to [email or web address] - password [email or web address]  [facility] -  Office  [phone]

## 2016-06-15 NOTE — Progress Notes (Signed)
Floor RN to clarify renal doctor about crcl below 30.

## 2016-06-15 NOTE — Progress Notes (Signed)
Peripherally Inserted Central Catheter/Midline Placement  The IV Nurse has discussed with the patient and/or persons authorized to consent for the patient, the purpose of this procedure and the potential benefits and risks involved with this procedure.  The benefits include less needle sticks, lab draws from the catheter, and the patient may be discharged home with the catheter. Risks include, but not limited to, infection, bleeding, blood clot (thrombus formation), and puncture of an artery; nerve damage and irregular heartbeat and possibility to perform a PICC exchange if needed/ordered by physician.  Alternatives to this procedure were also discussed.  Bard Power PICC patient education guide, fact sheet on infection prevention and patient information card has been provided to patient /or left at bedside.    PICC/Midline Placement Documentation  PICC Double Lumen 06/15/16 PICC Right Brachial 41 cm (Active)  Indication for Insertion or Continuance of Line Prolonged intravenous therapies 06/15/2016  1:00 PM  Dressing Change Due 06/15/16 06/15/2016  1:00 PM      Consent obtained by Christella Noa RN Marianna Payment M 06/15/2016, 1:16 PM

## 2016-06-16 LAB — CBC
HCT: 18.5 % — ABNORMAL LOW (ref 39.0–52.0)
HEMATOCRIT: 23.7 % — AB (ref 39.0–52.0)
Hemoglobin: 6.7 g/dL — CL (ref 13.0–17.0)
Hemoglobin: 8.3 g/dL — ABNORMAL LOW (ref 13.0–17.0)
MCH: 33.7 pg (ref 26.0–34.0)
MCH: 32.3 pg (ref 26.0–34.0)
MCHC: 36.2 g/dL — AB (ref 30.0–36.0)
MCHC: 35 g/dL (ref 30.0–36.0)
MCV: 93 fL (ref 78.0–100.0)
MCV: 92.2 fL (ref 78.0–100.0)
PLATELETS: 127 10*3/uL — AB (ref 150–400)
Platelets: 121 10*3/uL — ABNORMAL LOW (ref 150–400)
RBC: 1.99 MIL/uL — ABNORMAL LOW (ref 4.22–5.81)
RBC: 2.57 MIL/uL — ABNORMAL LOW (ref 4.22–5.81)
RDW: 22.1 % — ABNORMAL HIGH (ref 11.5–15.5)
RDW: 22.2 % — AB (ref 11.5–15.5)
WBC: 12.1 10*3/uL — ABNORMAL HIGH (ref 4.0–10.5)
WBC: 13.4 10*3/uL — ABNORMAL HIGH (ref 4.0–10.5)

## 2016-06-16 LAB — BASIC METABOLIC PANEL
Anion gap: 11 (ref 5–15)
BUN: 93 mg/dL — ABNORMAL HIGH (ref 6–20)
CO2: 23 mmol/L (ref 22–32)
Calcium: 6.8 mg/dL — ABNORMAL LOW (ref 8.9–10.3)
Chloride: 103 mmol/L (ref 101–111)
Creatinine, Ser: 3.36 mg/dL — ABNORMAL HIGH (ref 0.61–1.24)
GFR, EST AFRICAN AMERICAN: 24 mL/min — AB (ref >60)
GFR, EST NON AFRICAN AMERICAN: 21 mL/min — AB (ref >60)
Glucose, Bld: 149 mg/dL — ABNORMAL HIGH (ref 65–99)
Potassium: 3.2 mmol/L — ABNORMAL LOW (ref 3.5–5.1)
SODIUM: 137 mmol/L (ref 135–145)

## 2016-06-16 LAB — PROTIME-INR
INR: 2.52
PROTHROMBIN TIME: 27.7 s — AB (ref 11.4–15.2)

## 2016-06-16 LAB — HEPATIC FUNCTION PANEL
ALT: 318 U/L — ABNORMAL HIGH (ref 17–63)
AST: 508 U/L — ABNORMAL HIGH (ref 15–41)
Albumin: 2.8 g/dL — ABNORMAL LOW (ref 3.5–5.0)
Alkaline Phosphatase: 65 U/L (ref 38–126)
BILIRUBIN INDIRECT: 10.4 mg/dL — AB (ref 0.3–0.9)
Bilirubin, Direct: 17.1 mg/dL — ABNORMAL HIGH (ref 0.1–0.5)
Total Bilirubin: 27.5 mg/dL (ref 0.3–1.2)
Total Protein: 5.5 g/dL — ABNORMAL LOW (ref 6.5–8.1)

## 2016-06-16 LAB — PREPARE RBC (CROSSMATCH)

## 2016-06-16 MED ORDER — SODIUM CHLORIDE 0.9 % IV SOLN: Freq: Once | INTRAVENOUS | Status: DC

## 2016-06-16 MED ORDER — POTASSIUM CHLORIDE CRYS ER 20 MEQ PO TBCR
40.0000 meq | EXTENDED_RELEASE_TABLET | Freq: Once | ORAL | Status: AC
  Administered 2016-06-16: 40 meq via ORAL
  Filled 2016-06-16: qty 2

## 2016-06-16 NOTE — Progress Notes (Signed)
Subjective: No acute events.  He feels much better.  Objective: Vital signs in last 24 hours: Temp:  [97.5 F (36.4 C)-98.9 F (37.2 C)] 97.5 F (36.4 C) (05/08 1336) Pulse Rate:  [70-77] 76 (05/08 1336) Resp:  [15-28] 17 (05/08 1336) BP: (116-129)/(44-68) 129/68 (05/08 1336) SpO2:  [99 %-100 %] 100 % (05/08 1336) Weight:  [125.3 kg (276 lb 3.8 oz)] 125.3 kg (276 lb 3.8 oz) (05/08 0409) Last BM Date: 06/16/16  Intake/Output from previous day: 05/07 0701 - 05/08 0700 In: 3617.8 [P.O.:1420; I.V.:1997.8; IV Piggyback:200] Out: 775 [Urine:775] Intake/Output this shift: Total I/O In: 354 [Blood:354] Out: -   General appearance: alert and no distress GI: soft, non-tender; bowel sounds normal; no masses,  no organomegaly Extremities: no asterixis  Lab Results:  Recent Labs  06/15/16 0529 06/15/16 1650 06/16/16 0424  WBC 13.8* 12.1* 12.1*  HGB 7.4* 7.1* 6.7*  HCT 20.6* 20.0* 18.5*  PLT 133* 135* 127*   BMET  Recent Labs  06/14/16 1729 06/15/16 0529 06/16/16 0424  NA 132* 134* 137  K 3.6 3.4* 3.2*  CL 99* 99* 103  CO2 18* 22 23  GLUCOSE 137* 153* 149*  BUN 133* 117* 93*  CREATININE 4.28* 3.93* 3.36*  CALCIUM 6.3* 6.5* 6.8*   LFT  Recent Labs  06/16/16 0424  PROT 5.5*  ALBUMIN 2.8*  AST 508*  ALT 318*  ALKPHOS 65  BILITOT 27.5*  BILIDIR 17.1*  IBILI 10.4*   PT/INR  Recent Labs  06/15/16 0529 06/16/16 0424  LABPROT 29.1* 27.7*  INR 2.68 2.52   Hepatitis Panel No results for input(s): HEPBSAG, HCVAB, HEPAIGM, HEPBIGM in the last 72 hours. C-Diff No results for input(s): CDIFFTOX in the last 72 hours. Fecal Lactopherrin No results for input(s): FECLLACTOFRN in the last 72 hours.  Studies/Results: No results found.  Medications:  Scheduled: . folic acid  1 mg Oral Daily  . lactulose  20 g Oral TID  . LORazepam  0-4 mg Intravenous Q12H  . midodrine  10 mg Oral TID WC  . multivitamin with minerals  1 tablet Oral Daily  . [START ON  06/17/2016] pantoprazole  40 mg Intravenous Q12H  . prednisoLONE  40 mg Oral Daily  . rifaximin  550 mg Oral BID  . thiamine  100 mg Oral Daily   Or  . thiamine  100 mg Intravenous Daily   Continuous: . sodium chloride    . cefTRIAXone (ROCEPHIN)  IV Stopped (06/15/16 1801)  . octreotide  (SANDOSTATIN)    IV infusion 50 mcg/hr (06/16/16 1230)    Assessment/Plan: 1) Decompensated cirrhosis/Shock liver/Possible ETOH hepatitis. 2) ETOH abuse - Last drink was approximately 1 month ago. 3) Probable HRS. 4) Anemia.   HGB has dropped, but no overt evidence of bleeding.  He is getting two units of PRBC.  His creatinine is improving, but urine output is still low.  Overall he is stable, but clinically he appears better.  Plan: 1) No new recommendations.  Continue with supportive care.  Hopefully he can continue to recover.  LOS: 4 days   Colin Hodges D 06/16/2016, 2:45 PM

## 2016-06-16 NOTE — Progress Notes (Signed)
S: Eating well.  Denies ant BRBPR O:BP (!) 124/58 (BP Location: Right Arm)   Pulse 77   Temp 98.2 F (36.8 C) (Oral)   Resp (!) 21   Ht 5' 10"  (1.778 m)   Wt 125.3 kg (276 lb 3.8 oz)   SpO2 100%   BMI 39.64 kg/m   Intake/Output Summary (Last 24 hours) at 06/16/16 0741 Last data filed at 06/16/16 0400  Gross per 24 hour  Intake          3617.83 ml  Output              775 ml  Net          2842.83 ml   Weight change: 12.4 kg (27 lb 5.4 oz) QHU:TMLYY and alert.  + jaundice and scleralicterus CVS:RRR Resp:Clear Abd:+ BS NT + distention, soft Ext: no edema NEURO:CNI Ox3 no asterixis   . folic acid  1 mg Oral Daily  . lactulose  20 g Oral TID  . LORazepam  0-4 mg Intravenous Q12H  . midodrine  10 mg Oral TID WC  . multivitamin with minerals  1 tablet Oral Daily  . [START ON 06/17/2016] pantoprazole  40 mg Intravenous Q12H  . potassium chloride  40 mEq Oral Once  . prednisoLONE  40 mg Oral Daily  . rifaximin  550 mg Oral BID  . thiamine  100 mg Oral Daily   Or  . thiamine  100 mg Intravenous Daily   No results found. BMET    Component Value Date/Time   NA 137 06/16/2016 0424   K 3.2 (L) 06/16/2016 0424   CL 103 06/16/2016 0424   CO2 23 06/16/2016 0424   GLUCOSE 149 (H) 06/16/2016 0424   BUN 93 (H) 06/16/2016 0424   CREATININE 3.36 (H) 06/16/2016 0424   CALCIUM 6.8 (L) 06/16/2016 0424   GFRNONAA 21 (L) 06/16/2016 0424   GFRAA 24 (L) 06/16/2016 0424   CBC    Component Value Date/Time   WBC 12.1 (H) 06/16/2016 0424   RBC 1.99 (L) 06/16/2016 0424   HGB 6.7 (LL) 06/16/2016 0424   HCT 18.5 (L) 06/16/2016 0424   PLT 127 (L) 06/16/2016 0424   MCV 93.0 06/16/2016 0424   MCH 33.7 06/16/2016 0424   MCHC 36.2 (H) 06/16/2016 0424   RDW 22.1 (H) 06/16/2016 0424   LYMPHSABS 1.3 01/08/2014 2029   MONOABS 0.8 01/08/2014 2029   EOSABS 0 01/08/2014 2029   BASOSABS 0 01/08/2014 2029     Assessment:  1. Acute Renal failure most likely from HRS.  UO remains incomplete  but Scr sl lower.  His hyperbilirubinemia puts him at risk for ATN however 2. ETOH cirrhosis 3. Mild Hypokalemia 4. Met acidosis, on bicarb gtt, improving 5. Anemia,   upper GI bleed.  Note plans for transfusion  Plan: 1. DC bicarb 2. Daily labs 3. Replace K as you have done 4. Agree with Dr Jonnie Finner that HD is not an option   Colin Hodges T

## 2016-06-16 NOTE — Progress Notes (Signed)
Pt girlfriend brought pt food from home ( some type of noodles). Pt on clear liquid diet and reminded of diet. Pt stated MD said he could eat regular food earlier.No orders or notes seen of MD changing pt diet. RN educated pt on clear liquid diet and what food it consist of. Pt girlfriend stated he told her to bring it RN educated girlfriend that per orders pt is still on clear liquid diet. Pt ate food despite education. Will continue to monitor and pass information to day shift RN.

## 2016-06-16 NOTE — Progress Notes (Signed)
PROGRESS NOTE                                                                                                                                                                                                             Patient Demographics:    Colin Hodges, is a 46 y.o. male, DOB - 08/31/1970, RFF:[id number]  Admit date - 06/12/2016   Admitting Physician Carol Ada, MD  Outpatient Primary MD for the patient is Patient, No Pcp Per  LOS - 4  Outpatient Specialists:Dr. Benson Norway (GI)  No chief complaint on file.      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 He reported feeling weak and had some epigastric discomfort. His girlfriend at bedside reports him to be getting confused for last few days. Patient reports poor by mouth intake and having some hematemesis as well. Denies any fall, loss of consciousness, chest pain, shortness of breath, fevers, abdominal pain or distention. Denies any dysuria or diarrhea. Girlfriend reports that he has not been drinking since he got hospitalized in 2015. He called Dr. Benson Norway and was hostile, to the hospital. 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 His MELD score was 27 at that time and could not get a TIPS.  On admission he had significant leukocytosis (37K) with a hemoglobin of 7.2 which dropped to 5.5 this morning. Chemistry showed sodium of 129, K of 5.2, chloride 97, CO2 of 11, BUN of 133 and creatinine of 4.77 and INR of 2.84. He had an anion gap of 21. Patient placed on octreotide drip, IV Protonix. aHe also received 2 units FFP on admission along with vitamin K.      Subjective:   Continues to remain oriented. Denies hematemesis or melena. Denies abdominal  pain.   Assessment  & Plan :    Principal Problem:    Decompensated hepatic cirrhosis (HCC) With associated upper GI bleed and coagulopathy.. EGD shows nonbleeding portal hypertensive gastropathy, hemorrhagic duodenopathy without active bleeding. -On octreotide drip. Transition to PPI twice a day. -Drop in H&H again noted (hemoglobin 6.7) order 2 units PRBC [ total 6 units since admission]. Received 2 unit FFP, vitamin K daily 3  days -empiric Rocephin for SBP prophylaxis. MELD score >40. Significant transaminitis possibly due to acute hepatitis/shock liver. LFTs slowly improving but bilirubin remains high. Not a candidate for TIPSS or liver transplant ( last drink was about 6 months ago). Also not a candidate for dialysis. - encephalopathy improved.  GI following closely.   Acute upper GI bleed Secondary to findings as above. Further drop in H&H. Order 2 units PRBC. Monitor H&H closely.  Acute Alcoholic hepatitis Continue oral prednisone. LFTs slowly improving.  Acute kidney injury with? Hepatorenal syndrome ([place]) Reportedly had normal renal function a few months back. ATN  and metabolic acidosis on presentation. Renal ultrasound without medical renal disease or obstruction. Has decent urine output. Renal consult appreciated. HRS protocol initiated. Received IV albumin. Continue midodrine. Bicarbonate drip discontinued. Renal function improving. Patient is not a dialysis candidate. Monitor daily labs.  Hyponatremia Secondary to decompensated liver disease versus HRS. resolved.  Hepatic/uremic encephalopathy. Encephalopathy resolved.. Continue rifaximin and lactulose.  Alcohol abuse Reports last drink to be about 6 months back. Decides to quit. On CIWA.   Hyperkalemia Resolved. Now being replenished for low potassium.  Leukocytosis Secondary to acute illness/SBP. Continue empiric Rocephin. Improved.  Code Status : Full code  Family Communication  none at bedside  (girlfriend involved in care)   Disposition Plan  : Pending hospital course. Prognosis is still guarded  Barriers For Discharge : Active symptoms  Consults  :   [facility]  Procedures  :  Renal ultrasound EGD  DVT Prophylaxis  : SCDs/coagulopathy  Lab Results  Component Value Date   PLT 127 (L) 06/16/2016    Antibiotics  :    Anti-infectives    Start     Dose/Rate Route Frequency Ordered Stop   06/13/16 2200  rifaximin (XIFAXAN) tablet 550 mg     550 mg Oral 2 times daily 06/13/16 1738     06/12/16 1800  cefTRIAXone (ROCEPHIN) 1 g in dextrose 5 % 50 mL IVPB     1 g 100 mL/hr over 30 Minutes Intravenous Every 24 hours 06/12/16 1706          Objective:   Vitals:   06/16/16 0414 06/16/16 0722 06/16/16 0925 06/16/16 0948  BP: (!) 116/44 (!) 124/58 (!) 116/59 122/62  Pulse: 73 77 73 77  Resp: (!) 22 (!) 21 17 19   Temp: 98 F (36.7 C) 98.2 F (36.8 C) 98.1 F (36.7 C) 98 F (36.7 C)  TempSrc: Oral Oral Oral Oral  SpO2: 100% 100% 100% 100%  Weight:      Height:        Wt Readings from Last 3 Encounters:  06/16/16 125.3 kg (276 lb 3.8 oz)  01/14/14 98.2 kg (216 lb 8 oz)  10/20/11 113.4 kg (250 lb)     Intake/Output Summary (Last 24 hours) at 06/16/16 1018 Last data filed at 06/16/16 0400  Gross per 24 hour  Intake          3291.58 ml  Output              775 ml  Net          2516.58 ml     Physical Exam Gen.: Severely jaundiced, not in distress HEENT: Icteric, pallor present, moist mucosa Chest: Clear to auscultation bilaterally  CVS: Normal S1 and S2, no murmurs GI: Soft, mild abdominal distention, nontender Musculoskeletal: Trace pitting edema bilaterally, CNS: Alert and oriented, no tremors    Data Review:    CBC  Recent Labs Lab 06/14/16 0835 06/14/16 1729 06/15/16 0529 06/15/16 1650 06/16/16 0424  WBC 12.2* 14.1* 13.8* 12.1* 12.1*  HGB 6.0* 7.9* 7.4* 7.1* 6.7*  HCT 16.8* 22.4* 20.6* 20.0* 18.5*  PLT 113* 130* 133*  135* 127*  MCV 94.4 91.4 90.7 91.7 93.0  MCH 33.7 32.2 32.6 32.6 33.7  MCHC 35.7 35.3 35.9 35.5 36.2*  RDW 22.6* 21.2* 21.8* 22.2* 22.1*    Chemistries   Recent Labs Lab 06/12/16 1813 06/12/16 2028 06/13/16 1849 06/14/16 0624 06/14/16 1729 06/15/16 0529 06/16/16 0424  NA 129* 126* 130* 131* 132* 134* 137  K 5.2* 5.5* 5.1 3.6 3.6 3.4* 3.2*  CL 97* 96* 100* 98* 99* 99* 103  CO2 11* 9* 13* 16* 18* 22 23  GLUCOSE 92 99 135* 133* 137* 153* 149*  BUN 133* 134* 153* 148* 133* 117* 93*  CREATININE 4.77* 4.77* 4.66* 4.53* 4.28* 3.93* 3.36*  CALCIUM 7.6* 7.2* 6.5* 6.0* 6.3* 6.5* 6.8*  AST 255* 332*  --  1,269*  --  809* 508*  ALT 93* 112*  --  499*  --  432* 318*  ALKPHOS 92 90  --  72  --  71 65  BILITOT 22.8* 21.5*  --  25.3*  --  27.6* 27.5*   ------------------------------------------------------------------------------------------------------------------ No results for input(s): CHOL, HDL, LDLCALC, TRIG, CHOLHDL, LDLDIRECT in the last 72 hours.  No results found for: HGBA1C ------------------------------------------------------------------------------------------------------------------ No results for input(s): TSH, T4TOTAL, T3FREE, THYROIDAB in the last 72 hours.  Invalid input(s): FREET3 ------------------------------------------------------------------------------------------------------------------ No results for input(s): VITAMINB12, FOLATE, FERRITIN, TIBC, IRON, RETICCTPCT in the last 72 hours.  Coagulation profile  Recent Labs Lab 06/12/16 1813 06/14/16 0624 06/15/16 0529 06/16/16 0424  INR 2.84 2.65 2.68 2.52    No results for input(s): DDIMER in the last 72 hours.  Cardiac Enzymes No results for input(s): CKMB, TROPONINI, MYOGLOBIN in the last 168 hours.  Invalid input(s): CK ------------------------------------------------------------------------------------------------------------------ No results found for: BNP  Inpatient Medications  Scheduled  Meds: . folic acid  1 mg Oral Daily  . lactulose  20 g Oral TID  . LORazepam  0-4 mg Intravenous Q12H  . midodrine  10 mg Oral TID WC  . multivitamin with minerals  1 tablet Oral Daily  . [START ON 06/17/2016] pantoprazole  40 mg Intravenous Q12H  . prednisoLONE  40 mg Oral Daily  . rifaximin  550 mg Oral BID  . thiamine  100 mg Oral Daily   Or  . thiamine  100 mg Intravenous Daily   Continuous Infusions: . sodium chloride    . cefTRIAXone (ROCEPHIN)  IV Stopped (06/15/16 1801)  . octreotide  (SANDOSTATIN)    IV infusion 50 mcg/hr (06/16/16 0009)   PRN Meds:.sodium chloride flush  Micro Results Recent Results (from the past 240 hour(s))  MRSA PCR Screening     Status: None   Collection Time: 06/12/16  5:12 PM  Result Value Ref Range Status   MRSA by PCR NEGATIVE NEGATIVE Final    Comment:        The GeneXpert MRSA Assay (FDA approved for NASAL specimens only), is one component of a comprehensive MRSA colonization surveillance program. It is not intended to diagnose MRSA infection nor to guide or monitor treatment for MRSA infections.     Radiology Reports US Abdomen Complete  Result Date: 06/02/2016 CLINICAL DATA:  Jaundice EXAM: ABDOMEN ULTRASOUND COMPLETE COMPARISON:  01/09/2014 FINDINGS: Gallbladder: Gallbladder is well distended with gallbladder wall thickening to 7.4 mm. Multiple calculi are noted. A  negative sonographic Murphy's sign is noted. Common bile duct: Diameter: 5.4 mm. Liver: Diffusely increased in echogenicity consistent with fatty infiltration. This is similar to that seen on the prior CT examination. The liver is also enlarged in size. Phasic flow is noted within the portal vein with a to and fro component. IVC: No abnormality visualized. Pancreas: Visualized portion unremarkable. Spleen: Splenomegaly is noted. Splenic volume is approximately 1,500 cubic cm. Perisplenic varices are again noted. Right Kidney: Length: 14.3 cm. Echogenicity within normal  limits. No mass or hydronephrosis visualized. Left Kidney: Length: 14.9 cm. Echogenicity within normal limits. No mass or hydronephrosis visualized. Abdominal aorta: No aneurysm visualized. Other findings: Mild ascites is noted. IMPRESSION: Gallbladder is well distended with evidence of cholelithiasis and gallbladder wall thickening. The wall thickening may be in part due to the underlying ascites. No sonographic Percell Miller sign is noted. Hepatosplenomegaly with diffuse fatty infiltration of the liver similar to that seen on prior CT examination. Perisplenic varices consistent with a degree of portal hypertension. Electronically Signed   By: Inez Catalina M.D.   On: 06/02/2016 10:05   US Renal  Result Date: 06/13/2016 CLINICAL DATA:  Acute kidney injury EXAM: RENAL / URINARY TRACT ULTRASOUND COMPLETE COMPARISON:  None. FINDINGS: Right Kidney: Length: 15.5 cm. Echogenicity within normal limits. No mass or hydronephrosis visualized. Left Kidney: Length: 15.5 cm. Echogenicity within normal limits. No mass or hydronephrosis visualized. Bladder: Appears normal for degree of bladder distention. Peritoneal ascites is visible in the perihepatic space. IMPRESSION: Normal kidneys and bladder.  Peritoneal ascites noted. Electronically Signed   By: Andreas Newport M.D.   On: 06/13/2016 06:20    Time Spent in minutes 35   Louellen Molder M.D on 06/16/2016 at 10:18 AM  Between 7am to 7pm - Pager - [phone]  After 7pm go to [email or web address] - password [facility]  [facility] -  Office  [phone]

## 2016-06-16 NOTE — Progress Notes (Signed)
CRITICAL VALUE ALERT  Critical value received:  Hemoglobin 6.7  Date of notification:  06/16/16  Time of notification:  8648  Critical value read back:Yes.    Nurse who received alert:  Elvis Coil  MD notified (1st page):  Baltazar Najjar NP  Time of first page:  [phone]

## 2016-06-17 DIAGNOSIS — N179 Acute kidney failure, unspecified: Secondary | ICD-10-CM

## 2016-06-17 DIAGNOSIS — K2901 Acute gastritis with bleeding: Secondary | ICD-10-CM

## 2016-06-17 DIAGNOSIS — F1010 Alcohol abuse, uncomplicated: Secondary | ICD-10-CM

## 2016-06-17 LAB — TYPE AND SCREEN
ABO/RH(D): A POS
ANTIBODY SCREEN: NEGATIVE
Unit division: 0
Unit division: 0

## 2016-06-17 LAB — BASIC METABOLIC PANEL
Anion gap: 10 (ref 5–15)
BUN: 69 mg/dL — AB (ref 6–20)
CALCIUM: 7.2 mg/dL — AB (ref 8.9–10.3)
CHLORIDE: 105 mmol/L (ref 101–111)
CO2: 21 mmol/L — AB (ref 22–32)
CREATININE: 2.79 mg/dL — AB (ref 0.61–1.24)
GFR calc Af Amer: 30 mL/min — ABNORMAL LOW (ref >60)
GFR calc non Af Amer: 26 mL/min — ABNORMAL LOW (ref >60)
GLUCOSE: 150 mg/dL — AB (ref 65–99)
Potassium: 3.4 mmol/L — ABNORMAL LOW (ref 3.5–5.1)
Sodium: 136 mmol/L (ref 135–145)

## 2016-06-17 LAB — CBC
HCT: 23.7 % — ABNORMAL LOW (ref 39.0–52.0)
HEMOGLOBIN: 8.2 g/dL — AB (ref 13.0–17.0)
MCH: 32.2 pg (ref 26.0–34.0)
MCHC: 34.6 g/dL (ref 30.0–36.0)
MCV: 92.9 fL (ref 78.0–100.0)
Platelets: 104 10*3/uL — ABNORMAL LOW (ref 150–400)
RBC: 2.55 MIL/uL — AB (ref 4.22–5.81)
RDW: 23.1 % — ABNORMAL HIGH (ref 11.5–15.5)
WBC: 11.6 10*3/uL — ABNORMAL HIGH (ref 4.0–10.5)

## 2016-06-17 LAB — BPAM RBC
Blood Product Expiration Date: 201805152359
Blood Product Expiration Date: 201805152359
ISSUE DATE / TIME: 201805081259
ISSUE DATE / TIME: 201805080906
UNIT TYPE AND RH: 6200
Unit Type and Rh: 6200

## 2016-06-17 LAB — HEPATIC FUNCTION PANEL
ALK PHOS: 89 U/L (ref 38–126)
ALT: 271 U/L — AB (ref 17–63)
AST: 316 U/L — ABNORMAL HIGH (ref 15–41)
Albumin: 2.6 g/dL — ABNORMAL LOW (ref 3.5–5.0)
BILIRUBIN DIRECT: 18.6 mg/dL — AB (ref 0.1–0.5)
BILIRUBIN INDIRECT: 9.9 mg/dL — AB (ref 0.3–0.9)
BILIRUBIN TOTAL: 28.5 mg/dL — AB (ref 0.3–1.2)
Total Protein: 5.5 g/dL — ABNORMAL LOW (ref 6.5–8.1)

## 2016-06-17 LAB — PROTIME-INR
INR: 2.49
PROTHROMBIN TIME: 27.4 s — AB (ref 11.4–15.2)

## 2016-06-17 LAB — AMMONIA: AMMONIA: 50 umol/L — AB (ref 9–35)

## 2016-06-17 LAB — MAGNESIUM: Magnesium: 2.3 mg/dL (ref 1.7–2.4)

## 2016-06-17 MED ORDER — POTASSIUM CHLORIDE CRYS ER 20 MEQ PO TBCR
40.0000 meq | EXTENDED_RELEASE_TABLET | Freq: Once | ORAL | Status: AC
  Administered 2016-06-17: 40 meq via ORAL
  Filled 2016-06-17: qty 2

## 2016-06-17 MED ORDER — MAGNESIUM SULFATE 50 % IJ SOLN
1.0000 g | Freq: Once | INTRAMUSCULAR | Status: DC
  Filled 2016-06-17: qty 2

## 2016-06-17 MED ORDER — OXYMETAZOLINE HCL 0.05 % NA SOLN
1.0000 | Freq: Two times a day (BID) | NASAL | Status: DC | PRN
  Filled 2016-06-17: qty 15

## 2016-06-17 MED ORDER — MAGNESIUM SULFATE IN D5W 1-5 GM/100ML-% IV SOLN
1.0000 g | Freq: Once | INTRAVENOUS | Status: AC
  Administered 2016-06-17: 1 g via INTRAVENOUS
  Filled 2016-06-17: qty 100

## 2016-06-17 NOTE — Care Management Note (Addendum)
Case Management Note  Patient Details  Name: Colin Hodges MRN: [id number] Date of Birth: 03/14/1970  Subjective/Objective:   From home, presents with GIB,  Hepatic cirrhosis, ALcohol hepatitis, AKI  , hyponatremia, uremic encephalopathy, Alcohol abuse, hyperkalemia , leukocytosis, ? Hepatorenal syndrome.  Patient with no insurance, he would like to keep his pcp Dr. Benson Norway, will need med ast at discharge, with [facility].           Action/Plan: NCM will follow for dc needs.  Expected Discharge Date:                  Expected Discharge Plan:     In-House Referral:     Discharge planning Services  CM Consult  Post Acute Care Choice:    Choice offered to:     DME Arranged:    DME Agency:     HH Arranged:    HH Agency:     Status of Service:  In process, will continue to follow  If discussed at Long Length of Stay Meetings, dates discussed:    Additional Comments:  Zenon Mayo, RN 06/17/2016, 3:07 PM

## 2016-06-17 NOTE — Progress Notes (Signed)
Subjective: No complaints.  Objective: Vital signs in last 24 hours: Temp:  [97.5 F (36.4 C)-98.9 F (37.2 C)] 98.3 F (36.8 C) (05/09 0354) Pulse Rate:  [67-77] 71 (05/09 0354) Resp:  [15-21] 19 (05/09 0354) BP: (116-143)/(54-69) 122/54 (05/09 0354) SpO2:  [99 %-100 %] 100 % (05/09 0354) Weight:  [127.7 kg (281 lb 8.4 oz)] 127.7 kg (281 lb 8.4 oz) (05/09 0354) Last BM Date: 06/16/16  Intake/Output from previous day: 05/08 0701 - 05/09 0700 In: 1430.7 [I.V.:650; Blood:780.7] Out: 1800 [Urine:1800] Intake/Output this shift: No intake/output data recorded.  General appearance: alert and no distress GI: soft, non-tender; bowel sounds normal; no masses,  no organomegaly Extremities: no asterixis  Lab Results:  Recent Labs  06/16/16 0424 06/16/16 1846 06/17/16 0536  WBC 12.1* 13.4* 11.6*  HGB 6.7* 8.3* 8.2*  HCT 18.5* 23.7* 23.7*  PLT 127* 121* PENDING   BMET  Recent Labs  06/15/16 0529 06/16/16 0424 06/17/16 0536  NA 134* 137 136  K 3.4* 3.2* 3.4*  CL 99* 103 105  CO2 22 23 21*  GLUCOSE 153* 149* 150*  BUN 117* 93* 69*  CREATININE 3.93* 3.36* 2.79*  CALCIUM 6.5* 6.8* 7.2*   LFT  Recent Labs  06/17/16 0536  PROT 5.5*  ALBUMIN 2.6*  AST 316*  ALT 271*  ALKPHOS 89  BILITOT 28.5*  BILIDIR 18.6*  IBILI 9.9*   PT/INR  Recent Labs  06/16/16 0424 06/17/16 0536  LABPROT 27.7* 27.4*  INR 2.52 2.49   Hepatitis Panel No results for input(s): HEPBSAG, HCVAB, HEPAIGM, HEPBIGM in the last 72 hours. C-Diff No results for input(s): CDIFFTOX in the last 72 hours. Fecal Lactopherrin No results for input(s): FECLLACTOFRN in the last 72 hours.  Studies/Results: No results found.  Medications:  Scheduled: . folic acid  1 mg Oral Daily  . lactulose  20 g Oral TID  . midodrine  10 mg Oral TID WC  . multivitamin with minerals  1 tablet Oral Daily  . pantoprazole  40 mg Intravenous Q12H  . prednisoLONE  40 mg Oral Daily  . rifaximin  550 mg Oral BID   . thiamine  100 mg Oral Daily   Or  . thiamine  100 mg Intravenous Daily   Continuous: . sodium chloride    . cefTRIAXone (ROCEPHIN)  IV Stopped (06/16/16 1756)  . octreotide  (SANDOSTATIN)    IV infusion 50 mcg/hr (06/17/16 0600)    Assessment/Plan: 1) Decompensated cirrhosis. 2) ETOH abuse. 3) ? HRS - improving! 4) Shock liver - improving. 5) Anemia - Stable s/p 2 units PRBC.   Bilirubin is the last value to improve.  INR is slowly improving, which is a good sign for the synthetic function.  No issues with hepatic encephalopathy.  He states that he is urinating a significant amount and his UOP is recorded as 1800 ml.  Creatinine is down at 2.7.  These are very encouraging signs!  Plan: 1) Continue with supportive care. 2) Proper nutrition.  LOS: 5 days   Tangi Shroff D 06/17/2016, 7:31 AM

## 2016-06-17 NOTE — Progress Notes (Signed)
PROGRESS NOTE                                                                                                                                                                                                             Patient Demographics:    Colin Hodges, is a 46 y.o. male, DOB - 10-02-1970, ZOX:[id number]  Admit date - 06/12/2016   Admitting Physician Carol Ada, MD  Outpatient Primary MD for the patient is Patient, No Pcp Per  LOS - 5  Outpatient Specialists:Dr. Benson Norway (GI)  No chief complaint on file.      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 He reported feeling weak and had some epigastric discomfort. His girlfriend at bedside reports him to be getting confused for last few days. Girlfriend Reported that he has not been drinking since he got hospitalized in 2015. He called Dr. Benson Norway and was hostile, to the hospital. 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 His MELD score was 27 at that time and could not get a TIPS.  On admission he had significant leukocytosis (37K) with a hemoglobin of 7.2 which dropped to 5.5 this morning. Chemistry showed sodium of 129, K of 5.2, chloride 97, CO2 of 11, BUN of 133 and creatinine of 4.77 and INR of 2.84. He had an anion gap of 21. Patient placed on octreotide drip, IV Protonix. aHe also received 2 units FFP on admission along with vitamin K.      Subjective:   Continues to remain oriented. Denies hematemesis or melena. Denies abdominal pain.   Assessment  & Plan :      Decompensated hepatic cirrhosis (HCC)/shock liver/possible EtOH hepatitis With associated upper GI bleed and coagulopathy.. EGD shows nonbleeding portal hypertensive  gastropathy, hemorrhagic duodenopathy without active bleeding. -On octreotide drip. Continue PPI twice a day. Status post 6 units since admission . Received 2 unit FFP, vitamin K daily 3 days, hemoglobin 8.2 -empiric Rocephin for SBP prophylaxis. MELD score >40. Significant transaminitis possibly due to acute hepatitis/shock liver. LFTs slowly improving but bilirubin remains high. Not a candidate for TIPSS or liver transplant ( last drink was about 6 months ago). Also not  a candidate for dialysis. encephalopathy improved. INR 2.49 GI following closely.   Acute upper GI bleed Management as above  Acute Alcoholic hepatitis Continue oral prednisolone. LFTs slowly improving.  Acute kidney injury with? Hepatorenal syndrome ([place]) Reportedly had normal renal function a few months back. ATN  and metabolic acidosis on presentation. Renal ultrasound without medical renal disease or obstruction. Has decent urine output. Renal consult appreciated. HRS protocol initiated. Received IV albumin. Continue midodrine. Bicarbonate drip discontinued. Renal function improving. Patient is not a dialysis candidate. Monitor daily labs.  Hyponatremia Secondary to decompensated liver disease versus HRS. resolved.  Hepatic/uremic encephalopathy. Encephalopathy resolved.. Continue rifaximin and lactulose.  Alcohol abuse Reports last drink to be about 6 months back. Decides to quit. On CIWA.   Hyperkalemia, now hypokalemic Replete, check magnesium  Leukocytosis Secondary to acute illness/SBP. Continue empiric Rocephin. Improved.    Code Status : Full code  Family Communication  none at bedside (girlfriend involved in care)   Disposition Plan  : Pending hospital course. Prognosis is still guarded  Barriers For Discharge : Active symptoms  Consults  :   [facility]  Procedures  :  Renal ultrasound EGD  DVT Prophylaxis  : SCDs/coagulopathy  Lab Results  Component Value Date    PLT PENDING 06/17/2016    Antibiotics  :    Anti-infectives    Start     Dose/Rate Route Frequency Ordered Stop   06/13/16 2200  rifaximin (XIFAXAN) tablet 550 mg     550 mg Oral 2 times daily 06/13/16 1738     06/12/16 1800  cefTRIAXone (ROCEPHIN) 1 g in dextrose 5 % 50 mL IVPB     1 g 100 mL/hr over 30 Minutes Intravenous Every 24 hours 06/12/16 1706          Objective:   Vitals:   06/16/16 1553 06/16/16 1941 06/16/16 2303 06/17/16 0354  BP: 127/66 (!) 143/67 138/69 (!) 122/54  Pulse: 67 73 67 71  Resp: 18 19 (!) 21 19  Temp: 98.2 F (36.8 C) 98 F (36.7 C) 98.3 F (36.8 C) 98.3 F (36.8 C)  TempSrc:  Oral Oral Oral  SpO2: 100% 100% 100% 100%  Weight:    127.7 kg (281 lb 8.4 oz)  Height:        Wt Readings from Last 3 Encounters:  06/17/16 127.7 kg (281 lb 8.4 oz)  01/14/14 98.2 kg (216 lb 8 oz)  10/20/11 113.4 kg (250 lb)     Intake/Output Summary (Last 24 hours) at 06/17/16 0735 Last data filed at 06/17/16 0600  Gross per 24 hour  Intake          1430.67 ml  Output             1800 ml  Net          -369.33 ml     Physical Exam Gen.: Severely jaundiced, not in distress HEENT: Icteric, pallor present, moist mucosa Chest: Clear to auscultation bilaterally  CVS: Normal S1 and S2, no murmurs GI: Soft, mild abdominal distention, nontender Musculoskeletal: Trace pitting edema bilaterally, CNS: Alert and oriented, no tremors    Data Review:    CBC  Recent Labs Lab 06/15/16 0529 06/15/16 1650 06/16/16 0424 06/16/16 1846 06/17/16 0536  WBC 13.8* 12.1* 12.1* 13.4* 11.6*  HGB 7.4* 7.1* 6.7* 8.3* 8.2*  HCT 20.6* 20.0* 18.5* 23.7* 23.7*  PLT 133* 135* 127* 121* PENDING  MCV 90.7 91.7 93.0 92.2 92.9  MCH 32.6 32.6 33.7 32.3  32.2  MCHC 35.9 35.5 36.2* 35.0 34.6  RDW 21.8* 22.2* 22.1* 22.2* 23.1*    Chemistries   Recent Labs Lab 06/12/16 2028  06/14/16 0624 06/14/16 1729 06/15/16 0529 06/16/16 0424 06/17/16 0536  NA 126*  < > 131* 132*  134* 137 136  K 5.5*  < > 3.6 3.6 3.4* 3.2* 3.4*  CL 96*  < > 98* 99* 99* 103 105  CO2 9*  < > 16* 18* 22 23 21*  GLUCOSE 99  < > 133* 137* 153* 149* 150*  BUN 134*  < > 148* 133* 117* 93* 69*  CREATININE 4.77*  < > 4.53* 4.28* 3.93* 3.36* 2.79*  CALCIUM 7.2*  < > 6.0* 6.3* 6.5* 6.8* 7.2*  AST 332*  --  1,269*  --  809* 508* 316*  ALT 112*  --  499*  --  432* 318* 271*  ALKPHOS 90  --  72  --  71 65 89  BILITOT 21.5*  --  25.3*  --  27.6* 27.5* 28.5*  < > = values in this interval not displayed. ------------------------------------------------------------------------------------------------------------------ No results for input(s): CHOL, HDL, LDLCALC, TRIG, CHOLHDL, LDLDIRECT in the last 72 hours.  No results found for: HGBA1C ------------------------------------------------------------------------------------------------------------------ No results for input(s): TSH, T4TOTAL, T3FREE, THYROIDAB in the last 72 hours.  Invalid input(s): FREET3 ------------------------------------------------------------------------------------------------------------------ No results for input(s): VITAMINB12, FOLATE, FERRITIN, TIBC, IRON, RETICCTPCT in the last 72 hours.  Coagulation profile  Recent Labs Lab 06/12/16 1813 06/14/16 0624 06/15/16 0529 06/16/16 0424 06/17/16 0536  INR 2.84 2.65 2.68 2.52 2.49    No results for input(s): DDIMER in the last 72 hours.  Cardiac Enzymes No results for input(s): CKMB, TROPONINI, MYOGLOBIN in the last 168 hours.  Invalid input(s): CK ------------------------------------------------------------------------------------------------------------------ No results found for: BNP  Inpatient Medications  Scheduled Meds: . folic acid  1 mg Oral Daily  . lactulose  20 g Oral TID  . midodrine  10 mg Oral TID WC  . multivitamin with minerals  1 tablet Oral Daily  . pantoprazole  40 mg Intravenous Q12H  . prednisoLONE  40 mg Oral Daily  . rifaximin  550  mg Oral BID  . thiamine  100 mg Oral Daily   Or  . thiamine  100 mg Intravenous Daily   Continuous Infusions: . sodium chloride    . cefTRIAXone (ROCEPHIN)  IV Stopped (06/16/16 1756)  . octreotide  (SANDOSTATIN)    IV infusion 50 mcg/hr (06/17/16 0600)   PRN Meds:.sodium chloride flush  Micro Results Recent Results (from the past 240 hour(s))  MRSA PCR Screening     Status: None   Collection Time: 06/12/16  5:12 PM  Result Value Ref Range Status   MRSA by PCR NEGATIVE NEGATIVE Final    Comment:        The GeneXpert MRSA Assay (FDA approved for NASAL specimens only), is one component of a comprehensive MRSA colonization surveillance program. It is not intended to diagnose MRSA infection nor to guide or monitor treatment for MRSA infections.     Radiology Reports US Abdomen Complete  Result Date: 06/02/2016 CLINICAL DATA:  Jaundice EXAM: ABDOMEN ULTRASOUND COMPLETE COMPARISON:  01/09/2014 FINDINGS: Gallbladder: Gallbladder is well distended with gallbladder wall thickening to 7.4 mm. Multiple calculi are noted. A negative sonographic Murphy's sign is noted. Common bile duct: Diameter: 5.4 mm. Liver: Diffusely increased in echogenicity consistent with fatty infiltration. This is similar to that seen on the prior CT examination. The liver is also enlarged in size.  Phasic flow is noted within the portal vein with a to and fro component. IVC: No abnormality visualized. Pancreas: Visualized portion unremarkable. Spleen: Splenomegaly is noted. Splenic volume is approximately 1,500 cubic cm. Perisplenic varices are again noted. Right Kidney: Length: 14.3 cm. Echogenicity within normal limits. No mass or hydronephrosis visualized. Left Kidney: Length: 14.9 cm. Echogenicity within normal limits. No mass or hydronephrosis visualized. Abdominal aorta: No aneurysm visualized. Other findings: Mild ascites is noted. IMPRESSION: Gallbladder is well distended with evidence of cholelithiasis and  gallbladder wall thickening. The wall thickening may be in part due to the underlying ascites. No sonographic Percell Miller sign is noted. Hepatosplenomegaly with diffuse fatty infiltration of the liver similar to that seen on prior CT examination. Perisplenic varices consistent with a degree of portal hypertension. Electronically Signed   By: Inez Catalina M.D.   On: 06/02/2016 10:05   US Renal  Result Date: 06/13/2016 CLINICAL DATA:  Acute kidney injury EXAM: RENAL / URINARY TRACT ULTRASOUND COMPLETE COMPARISON:  None. FINDINGS: Right Kidney: Length: 15.5 cm. Echogenicity within normal limits. No mass or hydronephrosis visualized. Left Kidney: Length: 15.5 cm. Echogenicity within normal limits. No mass or hydronephrosis visualized. Bladder: Appears normal for degree of bladder distention. Peritoneal ascites is visible in the perihepatic space. IMPRESSION: Normal kidneys and bladder.  Peritoneal ascites noted. Electronically Signed   By: Andreas Newport M.D.   On: 06/13/2016 06:20    Time Spent in minutes 35   Reyne Dumas M.D on 06/17/2016 at 7:35 AM  Between 7am to 7pm - Pager - [phone]  After 7pm go to [email or web address] - password [facility]  [facility] -  Office  [phone]

## 2016-06-17 NOTE — Progress Notes (Signed)
  S: feels better.  Eating OK.  Has not been adhering to fluid restriction O:BP (!) 124/58 (BP Location: Left Arm)   Pulse 72   Temp 98.2 F (36.8 C) (Oral)   Resp (!) 21   Ht 5' 10"  (1.778 m)   Wt 127.7 kg (281 lb 8.4 oz)   SpO2 99%   BMI 40.39 kg/m   Intake/Output Summary (Last 24 hours) at 06/17/16 0820 Last data filed at 06/17/16 0737  Gross per 24 hour  Intake          1711.09 ml  Output             1500 ml  Net           211.09 ml   Weight change: 2.4 kg (5 lb 4.7 oz) SUP:JSRPR and alert.  + jaundice and scleral icterus CVS:RRR Resp:Clear Abd:+ BS NT + distention, soft Ext: tr edema NEURO:CNI Ox3 no asterixis   . folic acid  1 mg Oral Daily  . lactulose  20 g Oral TID  . midodrine  10 mg Oral TID WC  . multivitamin with minerals  1 tablet Oral Daily  . pantoprazole  40 mg Intravenous Q12H  . prednisoLONE  40 mg Oral Daily  . rifaximin  550 mg Oral BID  . thiamine  100 mg Oral Daily   Or  . thiamine  100 mg Intravenous Daily   No results found. BMET    Component Value Date/Time   NA 136 06/17/2016 0536   K 3.4 (L) 06/17/2016 0536   CL 105 06/17/2016 0536   CO2 21 (L) 06/17/2016 0536   GLUCOSE 150 (H) 06/17/2016 0536   BUN 69 (H) 06/17/2016 0536   CREATININE 2.79 (H) 06/17/2016 0536   CALCIUM 7.2 (L) 06/17/2016 0536   GFRNONAA 26 (L) 06/17/2016 0536   GFRAA 30 (L) 06/17/2016 0536   CBC    Component Value Date/Time   WBC 11.6 (H) 06/17/2016 0536   RBC 2.55 (L) 06/17/2016 0536   HGB 8.2 (L) 06/17/2016 0536   HCT 23.7 (L) 06/17/2016 0536   PLT 104 (L) 06/17/2016 0536   MCV 92.9 06/17/2016 0536   MCH 32.2 06/17/2016 0536   MCHC 34.6 06/17/2016 0536   RDW 23.1 (H) 06/17/2016 0536   LYMPHSABS 1.3 01/08/2014 2029   MONOABS 0.8 01/08/2014 2029   EOSABS 0 01/08/2014 2029   BASOSABS 0 01/08/2014 2029     Assessment:  1. Acute Renal failure most likely from HRS.  UO good, Scr trending down  His hyperbilirubinemia puts him at risk for ATN however 2.  ETOH cirrhosis 3. Mild Hypokalemia 4. Anemia, Hg stable post transfusion  Plan: 1. Replace K as needed 2. Daily labs 3. Agree with Dr Jonnie Finner that HD is not an option 4. Discussed the need to follow the fluid restriction   Divon Krabill T

## 2016-06-18 LAB — COMPREHENSIVE METABOLIC PANEL
ALT: 225 U/L — AB (ref 17–63)
ANION GAP: 8 (ref 5–15)
AST: 191 U/L — ABNORMAL HIGH (ref 15–41)
Albumin: 2.3 g/dL — ABNORMAL LOW (ref 3.5–5.0)
Alkaline Phosphatase: 91 U/L (ref 38–126)
BUN: 57 mg/dL — AB (ref 6–20)
CHLORIDE: 108 mmol/L (ref 101–111)
CO2: 19 mmol/L — AB (ref 22–32)
Calcium: 7.7 mg/dL — ABNORMAL LOW (ref 8.9–10.3)
Creatinine, Ser: 2.49 mg/dL — ABNORMAL HIGH (ref 0.61–1.24)
GFR calc Af Amer: 35 mL/min — ABNORMAL LOW (ref >60)
GFR calc non Af Amer: 30 mL/min — ABNORMAL LOW (ref >60)
GLUCOSE: 142 mg/dL — AB (ref 65–99)
POTASSIUM: 3.8 mmol/L (ref 3.5–5.1)
Sodium: 135 mmol/L (ref 135–145)
TOTAL PROTEIN: 5 g/dL — AB (ref 6.5–8.1)
Total Bilirubin: 25.2 mg/dL (ref 0.3–1.2)

## 2016-06-18 LAB — CBC
HEMATOCRIT: 22.1 % — AB (ref 39.0–52.0)
HEMOGLOBIN: 7.6 g/dL — AB (ref 13.0–17.0)
MCH: 32.5 pg (ref 26.0–34.0)
MCHC: 34.4 g/dL (ref 30.0–36.0)
MCV: 94.4 fL (ref 78.0–100.0)
Platelets: 98 10*3/uL — ABNORMAL LOW (ref 150–400)
RBC: 2.34 MIL/uL — ABNORMAL LOW (ref 4.22–5.81)
RDW: 22.9 % — AB (ref 11.5–15.5)
WBC: 10.9 10*3/uL — ABNORMAL HIGH (ref 4.0–10.5)

## 2016-06-18 LAB — PROTIME-INR
INR: 2.37
Prothrombin Time: 26.4 seconds — ABNORMAL HIGH (ref 11.4–15.2)

## 2016-06-18 NOTE — Progress Notes (Addendum)
PROGRESS NOTE                                                                                                                                                                                                             Patient Demographics:    Colin Hodges, is a 46 y.o. male, DOB - [birth year], LNL:[id number]  Admit date - 06/12/2016   Admitting Physician Carol Ada, MD  Outpatient Primary MD for the patient is Patient, No Pcp Per  LOS - 6  Outpatient Specialists:Dr. Benson Norway (GI)  No chief complaint on file.      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 He reported feeling weak and had some epigastric discomfort. His girlfriend at bedside reports him to be getting confused for last few days. Girlfriend Reported that he has not been drinking since he got hospitalized in 2015. He called Dr. Benson Norway and was hostile, to the hospital. 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 His MELD score was 27 at that time and could not get a TIPS.  On admission he had significant leukocytosis (37K) with a hemoglobin of 7.2 which dropped to 5.5 this morning. Chemistry showed sodium of 129, K of 5.2, chloride 97, CO2 of 11, BUN of 133 and creatinine of 4.77 and INR of 2.84. He had an anion gap of 21. Patient placed on octreotide drip, IV Protonix. aHe also received 2 units FFP on admission along with vitamin K.      Subjective:   Continues to remain oriented.bleeding from finger resolved   Assessment  & Plan :      Decompensated hepatic cirrhosis (HCC)/shock liver/possible EtOH hepatitis With associated upper GI bleed and coagulopathy.. EGD shows nonbleeding portal hypertensive gastropathy, hemorrhagic  duodenopathy without active bleeding. -On octreotide drip. Continue PPI twice a day. Status post 6 units since admission . Received 2 unit FFP, vitamin K daily 3 days, hemoglobin 8.2>7.6 -empiric Rocephin for SBP prophylaxis. MELD score >40. Significant transaminitis possibly due to acute hepatitis/shock liver. LFTs slowly improving but bilirubin remains high. Not a candidate for TIPSS or liver transplant ( last drink was about 6 months ago). Also not a candidate for dialysis.  encephalopathy improved. INR 2.37 AST, ALT continued to improve, bilirubin has improved from 28.5>25.2. Ammonia 50   Acute upper GI bleed As per GI if  hemoglobin continues to trend downwards, he may need repeat EGD 9/48  Acute Alcoholic hepatitis Continue oral prednisolone/rifaximin. LFTs slowly improving.  Acute kidney injury with? Hepatorenal syndrome ([place]) Reportedly had normal renal function a few months back. ATN  and metabolic acidosis on presentation. Renal ultrasound without medical renal disease or obstruction. Steady urine output Renal following. HRS protocol initiated. Received IV albumin. Continue midodrine. Bicarbonate drip discontinued. Renal function improving. Patient is not a dialysis candidate. Monitor daily labs.  Hyponatremia Secondary to decompensated liver disease versus HRS. resolved.  Hepatic/uremic encephalopathy. Encephalopathy resolved.. Continue rifaximin and lactulose.  Alcohol abuse Reports last drink to be about 6 months back. Stable on CIWA, no signs of withdrawal   Hyperkalemia, now hypokalemic Repleted, magnesium 2.3  Leukocytosis Secondary to acute illness/SBP. Continue empiric Rocephin. Improved.    Code Status : Full code  Family Communication  none at bedside (girlfriend involved in care)   Disposition Plan  : Pending hospital course. Prognosis is still guarded  Barriers For Discharge : Active symptoms  Consults  :   [facility]  Procedures   :  Renal ultrasound EGD  DVT Prophylaxis  : SCDs/coagulopathy  Lab Results  Component Value Date   PLT 98 (L) 06/18/2016    Antibiotics  :    Anti-infectives    Start     Dose/Rate Route Frequency Ordered Stop   06/13/16 2200  rifaximin (XIFAXAN) tablet 550 mg     550 mg Oral 2 times daily 06/13/16 1738     06/12/16 1800  cefTRIAXone (ROCEPHIN) 1 g in dextrose 5 % 50 mL IVPB     1 g 100 mL/hr over 30 Minutes Intravenous Every 24 hours 06/12/16 1706          Objective:   Vitals:   06/17/16 2243 06/17/16 2315 06/18/16 0425 06/18/16 0500  BP: (!) 130/58  (!) 98/58   Pulse:  61 66   Resp: (!) 21 11 17    Temp: 98.5 F (36.9 C)  98.7 F (37.1 C)   TempSrc: Oral  Oral   SpO2:  99% 100%   Weight:    127.2 kg (280 lb 8 oz)  Height:        Wt Readings from Last 3 Encounters:  06/18/16 127.2 kg (280 lb 8 oz)  01/14/14 98.2 kg (216 lb 8 oz)  10/20/11 113.4 kg (250 lb)     Intake/Output Summary (Last 24 hours) at 06/18/16 0752 Last data filed at 06/18/16 0400  Gross per 24 hour  Intake          1619.59 ml  Output             1775 ml  Net          -155.41 ml     Physical Exam Gen.: Severely jaundiced, not in distress HEENT: Icteric, pallor present, moist mucosa Chest: Clear to auscultation bilaterally  CVS: Normal S1 and S2, no murmurs GI: Soft, mild abdominal distention, nontender Musculoskeletal: Trace pitting edema bilaterally, CNS: Alert and oriented, no tremors    Data Review:    CBC  Recent Labs Lab 06/15/16 1650 06/16/16 0424 06/16/16 1846 06/17/16 0536 06/18/16 0426  WBC 12.1* 12.1* 13.4* 11.6* 10.9*  HGB 7.1* 6.7* 8.3* 8.2* 7.6*  HCT 20.0* 18.5* 23.7* 23.7* 22.1*  PLT 135* 127* 121* 104* 98*  MCV 91.7 93.0 92.2 92.9 94.4  MCH 32.6 33.7 32.3 32.2 32.5  MCHC 35.5 36.2* 35.0 34.6 34.4  RDW 22.2* 22.1* 22.2* 23.1* 22.9*    Chemistries   Recent Labs Lab 06/14/16 0624 06/14/16 1729 06/15/16 0529 06/16/16 0424 06/17/16 0536  06/17/16 1119 06/18/16 0426  NA 131* 132* 134* 137 136  --  135  K 3.6 3.6 3.4* 3.2* 3.4*  --  3.8  CL 98* 99* 99* 103 105  --  108  CO2 16* 18* 22 23 21*  --  19*  GLUCOSE 133* 137* 153* 149* 150*  --  142*  BUN 148* 133* 117* 93* 69*  --  57*  CREATININE 4.53* 4.28* 3.93* 3.36* 2.79*  --  2.49*  CALCIUM 6.0* 6.3* 6.5* 6.8* 7.2*  --  7.7*  MG  --   --   --   --   --  2.3  --   AST 1,269*  --  809* 508* 316*  --  191*  ALT 499*  --  432* 318* 271*  --  225*  ALKPHOS 72  --  71 65 89  --  91  BILITOT 25.3*  --  27.6* 27.5* 28.5*  --  25.2*   ------------------------------------------------------------------------------------------------------------------ No results for input(s): CHOL, HDL, LDLCALC, TRIG, CHOLHDL, LDLDIRECT in the last 72 hours.  No results found for: HGBA1C ------------------------------------------------------------------------------------------------------------------ No results for input(s): TSH, T4TOTAL, T3FREE, THYROIDAB in the last 72 hours.  Invalid input(s): FREET3 ------------------------------------------------------------------------------------------------------------------ No results for input(s): VITAMINB12, FOLATE, FERRITIN, TIBC, IRON, RETICCTPCT in the last 72 hours.  Coagulation profile  Recent Labs Lab 06/14/16 0624 06/15/16 0529 06/16/16 0424 06/17/16 0536 06/18/16 0426  INR 2.65 2.68 2.52 2.49 2.37    No results for input(s): DDIMER in the last 72 hours.  Cardiac Enzymes No results for input(s): CKMB, TROPONINI, MYOGLOBIN in the last 168 hours.  Invalid input(s): CK ------------------------------------------------------------------------------------------------------------------ No results found for: BNP  Inpatient Medications  Scheduled Meds: . folic acid  1 mg Oral Daily  . lactulose  20 g Oral TID  . midodrine  10 mg Oral TID WC  . multivitamin with minerals  1 tablet Oral Daily  . pantoprazole  40 mg Intravenous Q12H  .  prednisoLONE  40 mg Oral Daily  . rifaximin  550 mg Oral BID  . thiamine  100 mg Oral Daily   Or  . thiamine  100 mg Intravenous Daily   Continuous Infusions: . sodium chloride    . cefTRIAXone (ROCEPHIN)  IV Stopped (06/17/16 1802)  . octreotide  (SANDOSTATIN)    IV infusion 50 mcg/hr (06/18/16 0619)   PRN Meds:.oxymetazoline, sodium chloride flush  Micro Results Recent Results (from the past 240 hour(s))  MRSA PCR Screening     Status: None   Collection Time: 06/12/16  5:12 PM  Result Value Ref Range Status   MRSA by PCR NEGATIVE NEGATIVE Final    Comment:        The GeneXpert MRSA Assay (FDA approved for NASAL specimens only), is one component of a comprehensive MRSA colonization surveillance program. It is not intended to diagnose MRSA infection nor to guide or monitor treatment for MRSA infections.     Radiology Reports US Abdomen Complete  Result Date: 06/02/2016 CLINICAL DATA:  Jaundice EXAM: ABDOMEN ULTRASOUND COMPLETE COMPARISON:  01/09/2014 FINDINGS: Gallbladder: Gallbladder is well distended with gallbladder wall thickening to 7.4 mm. Multiple calculi are noted. A negative sonographic Murphy's sign is noted. Common bile duct: Diameter: 5.4 mm.  Liver: Diffusely increased in echogenicity consistent with fatty infiltration. This is similar to that seen on the prior CT examination. The liver is also enlarged in size. Phasic flow is noted within the portal vein with a to and fro component. IVC: No abnormality visualized. Pancreas: Visualized portion unremarkable. Spleen: Splenomegaly is noted. Splenic volume is approximately 1,500 cubic cm. Perisplenic varices are again noted. Right Kidney: Length: 14.3 cm. Echogenicity within normal limits. No mass or hydronephrosis visualized. Left Kidney: Length: 14.9 cm. Echogenicity within normal limits. No mass or hydronephrosis visualized. Abdominal aorta: No aneurysm visualized. Other findings: Mild ascites is noted. IMPRESSION:  Gallbladder is well distended with evidence of cholelithiasis and gallbladder wall thickening. The wall thickening may be in part due to the underlying ascites. No sonographic Percell Miller sign is noted. Hepatosplenomegaly with diffuse fatty infiltration of the liver similar to that seen on prior CT examination. Perisplenic varices consistent with a degree of portal hypertension. Electronically Signed   By: Inez Catalina M.D.   On: 06/02/2016 10:05   US Renal  Result Date: 06/13/2016 CLINICAL DATA:  Acute kidney injury EXAM: RENAL / URINARY TRACT ULTRASOUND COMPLETE COMPARISON:  None. FINDINGS: Right Kidney: Length: 15.5 cm. Echogenicity within normal limits. No mass or hydronephrosis visualized. Left Kidney: Length: 15.5 cm. Echogenicity within normal limits. No mass or hydronephrosis visualized. Bladder: Appears normal for degree of bladder distention. Peritoneal ascites is visible in the perihepatic space. IMPRESSION: Normal kidneys and bladder.  Peritoneal ascites noted. Electronically Signed   By: Andreas Newport M.D.   On: 06/13/2016 06:20    Time Spent in minutes 35   Reyne Dumas M.D on 06/18/2016 at 7:52 AM  Between 7am to 7pm - Pager - [phone]  After 7pm go to [email or web address] - password [facility]  [facility] -  Office  [phone]

## 2016-06-18 NOTE — Progress Notes (Signed)
  S:  No new CO O:BP (!) 98/58 (BP Location: Left Arm)   Pulse 66   Temp 98.7 F (37.1 C) (Oral)   Resp 17   Ht 5' 10"  (1.778 m)   Wt 127.2 kg (280 lb 8 oz)   SpO2 100%   BMI 40.25 kg/m   Intake/Output Summary (Last 24 hours) at 06/18/16 0804 Last data filed at 06/18/16 0400  Gross per 24 hour  Intake          1619.59 ml  Output             1775 ml  Net          -155.41 ml   Weight change: -0.466 kg (-1 lb 0.4 oz) IWO:EHOZY and alert.  + jaundice and scleral icterus CVS:RRR Resp:Clear Abd:+ BS NT + distention, soft Ext: tr edema NEURO:CNI Ox3 no asterixis   . folic acid  1 mg Oral Daily  . lactulose  20 g Oral TID  . midodrine  10 mg Oral TID WC  . multivitamin with minerals  1 tablet Oral Daily  . pantoprazole  40 mg Intravenous Q12H  . prednisoLONE  40 mg Oral Daily  . rifaximin  550 mg Oral BID  . thiamine  100 mg Oral Daily   Or  . thiamine  100 mg Intravenous Daily   No results found. BMET    Component Value Date/Time   NA 135 06/18/2016 0426   K 3.8 06/18/2016 0426   CL 108 06/18/2016 0426   CO2 19 (L) 06/18/2016 0426   GLUCOSE 142 (H) 06/18/2016 0426   BUN 57 (H) 06/18/2016 0426   CREATININE 2.49 (H) 06/18/2016 0426   CALCIUM 7.7 (L) 06/18/2016 0426   GFRNONAA 30 (L) 06/18/2016 0426   GFRAA 35 (L) 06/18/2016 0426   CBC    Component Value Date/Time   WBC 10.9 (H) 06/18/2016 0426   RBC 2.34 (L) 06/18/2016 0426   HGB 7.6 (L) 06/18/2016 0426   HCT 22.1 (L) 06/18/2016 0426   PLT 98 (L) 06/18/2016 0426   MCV 94.4 06/18/2016 0426   MCH 32.5 06/18/2016 0426   MCHC 34.4 06/18/2016 0426   RDW 22.9 (H) 06/18/2016 0426   LYMPHSABS 1.3 01/08/2014 2029   MONOABS 0.8 01/08/2014 2029   EOSABS 0 01/08/2014 2029   BASOSABS 0 01/08/2014 2029     Assessment:  1. Acute Renal failure most likely from HRS.  UO good, Scr cont to trend down  His hyperbilirubinemia puts him at risk for ATN however 2. ETOH cirrhosis 3. Mild Hypokalemia, improved 4.  Anemia  Plan: 1. Recheck labs tomorrow.  Will need to decide when to stop octreotide (started by GI), but would like to see Scr < 2   2. Agree with Dr Jonnie Finner that HD is not an option and recent studies suggest HD is poor option if pt not a liver Tx candidate    Akasia Ahmad T

## 2016-06-18 NOTE — Progress Notes (Signed)
Subjective: No acute events.  No complaints.  Objective: Vital signs in last 24 hours: Temp:  [98.4 F (36.9 C)-98.9 F (37.2 C)] 98.7 F (37.1 C) (05/10 0425) Pulse Rate:  [61-66] 66 (05/10 0425) Resp:  [11-24] 17 (05/10 0425) BP: (98-130)/(54-61) 98/58 (05/10 0425) SpO2:  [94 %-100 %] 100 % (05/10 0425) Weight:  [127.2 kg (280 lb 8 oz)] 127.2 kg (280 lb 8 oz) (05/10 0500) Last BM Date: 06/17/16  Intake/Output from previous day: 05/09 0701 - 05/10 0700 In: 1900 [P.O.:1200; I.V.:550; IV Piggyback:150] Out: 7893 [Urine:1775] Intake/Output this shift: No intake/output data recorded.  General appearance: alert and no distress GI: soft, non-tender; bowel sounds normal; no masses,  no organomegaly Extremities: no asterixis  Lab Results:  Recent Labs  06/16/16 1846 06/17/16 0536 06/18/16 0426  WBC 13.4* 11.6* 10.9*  HGB 8.3* 8.2* 7.6*  HCT 23.7* 23.7* 22.1*  PLT 121* 104* 98*   BMET  Recent Labs  06/16/16 0424 06/17/16 0536 06/18/16 0426  NA 137 136 135  K 3.2* 3.4* 3.8  CL 103 105 108  CO2 23 21* 19*  GLUCOSE 149* 150* 142*  BUN 93* 69* 57*  CREATININE 3.36* 2.79* 2.49*  CALCIUM 6.8* 7.2* 7.7*   LFT  Recent Labs  06/17/16 0536 06/18/16 0426  PROT 5.5* 5.0*  ALBUMIN 2.6* 2.3*  AST 316* 191*  ALT 271* 225*  ALKPHOS 89 91  BILITOT 28.5* 25.2*  BILIDIR 18.6*  --   IBILI 9.9*  --    PT/INR  Recent Labs  06/17/16 0536 06/18/16 0426  LABPROT 27.4* 26.4*  INR 2.49 2.37   Hepatitis Panel No results for input(s): HEPBSAG, HCVAB, HEPAIGM, HEPBIGM in the last 72 hours. C-Diff No results for input(s): CDIFFTOX in the last 72 hours. Fecal Lactopherrin No results for input(s): FECLLACTOFRN in the last 72 hours.  Studies/Results: No results found.  Medications:  Scheduled: . folic acid  1 mg Oral Daily  . lactulose  20 g Oral TID  . midodrine  10 mg Oral TID WC  . multivitamin with minerals  1 tablet Oral Daily  . pantoprazole  40 mg  Intravenous Q12H  . prednisoLONE  40 mg Oral Daily  . rifaximin  550 mg Oral BID  . thiamine  100 mg Oral Daily   Or  . thiamine  100 mg Intravenous Daily   Continuous: . sodium chloride    . cefTRIAXone (ROCEPHIN)  IV Stopped (06/17/16 1802)  . octreotide  (SANDOSTATIN)    IV infusion 50 mcg/hr (06/18/16 8101)    Assessment/Plan: 1) Decompensated cirrhosis. 2) ETOH abuse. 3) HRS 4) Shock liver - improving. 5) Anemia - slight decline.  No overt evidence of bleeding.  Stools are tan in color and loose secondary to lactulose.   He continues to recover.  No clear evidence of bleeding, but if the HGB continues to trend downwards, I will consider a repeat EGD.  Tomorrow I will stop his ceftriaxone as it will be one week, which is the prescribed amount.   Plan: 1) Continue with current supportive care.    LOS: 6 days   Colin Hodges 06/18/2016, 7:31 AM

## 2016-06-19 LAB — RENAL FUNCTION PANEL
ALBUMIN: 2.4 g/dL — AB (ref 3.5–5.0)
Anion gap: 8 (ref 5–15)
BUN: 46 mg/dL — AB (ref 6–20)
CO2: 18 mmol/L — ABNORMAL LOW (ref 22–32)
CREATININE: 2.2 mg/dL — AB (ref 0.61–1.24)
Calcium: 8.3 mg/dL — ABNORMAL LOW (ref 8.9–10.3)
Chloride: 110 mmol/L (ref 101–111)
GFR calc Af Amer: 40 mL/min — ABNORMAL LOW (ref >60)
GFR calc non Af Amer: 35 mL/min — ABNORMAL LOW (ref >60)
GLUCOSE: 111 mg/dL — AB (ref 65–99)
PHOSPHORUS: 4.2 mg/dL (ref 2.5–4.6)
POTASSIUM: 4.1 mmol/L (ref 3.5–5.1)
Sodium: 136 mmol/L (ref 135–145)

## 2016-06-19 LAB — PROTIME-INR
INR: 2.18
PROTHROMBIN TIME: 24.6 s — AB (ref 11.4–15.2)

## 2016-06-19 LAB — CBC
HEMATOCRIT: 25.3 % — AB (ref 39.0–52.0)
Hemoglobin: 8.6 g/dL — ABNORMAL LOW (ref 13.0–17.0)
MCH: 32.7 pg (ref 26.0–34.0)
MCHC: 34 g/dL (ref 30.0–36.0)
MCV: 96.2 fL (ref 78.0–100.0)
PLATELETS: 125 10*3/uL — AB (ref 150–400)
RBC: 2.63 MIL/uL — ABNORMAL LOW (ref 4.22–5.81)
RDW: 22.8 % — AB (ref 11.5–15.5)
WBC: 16 10*3/uL — AB (ref 4.0–10.5)

## 2016-06-19 LAB — HEPATIC FUNCTION PANEL
ALK PHOS: 104 U/L (ref 38–126)
ALT: 192 U/L — AB (ref 17–63)
AST: 141 U/L — AB (ref 15–41)
Albumin: 2.4 g/dL — ABNORMAL LOW (ref 3.5–5.0)
BILIRUBIN DIRECT: 15.2 mg/dL — AB (ref 0.1–0.5)
BILIRUBIN TOTAL: 25.3 mg/dL — AB (ref 0.3–1.2)
Indirect Bilirubin: 10.1 mg/dL — ABNORMAL HIGH (ref 0.3–0.9)
Total Protein: 5.6 g/dL — ABNORMAL LOW (ref 6.5–8.1)

## 2016-06-19 MED ORDER — PANTOPRAZOLE SODIUM 40 MG PO TBEC
40.0000 mg | DELAYED_RELEASE_TABLET | Freq: Every day | ORAL | Status: DC
  Administered 2016-06-19 – 2016-06-22 (×4): 40 mg via ORAL
  Filled 2016-06-19 (×4): qty 1

## 2016-06-19 NOTE — Progress Notes (Signed)
Subjective: No complaints.  Objective: Vital signs in last 24 hours: Temp:  [97.9 F (36.6 C)-98.5 F (36.9 C)] 98.2 F (36.8 C) (05/11 0755) Pulse Rate:  [55-80] 78 (05/11 0755) Resp:  [15-26] 20 (05/11 0755) BP: (102-142)/(51-78) 137/76 (05/11 0755) SpO2:  [98 %-100 %] 100 % (05/11 0755) Weight:  [126.8 kg (279 lb 9.6 oz)] 126.8 kg (279 lb 9.6 oz) (05/11 0531) Last BM Date: 06/18/16  Intake/Output from previous day: 05/10 0701 - 05/11 0700 In: 0174 [P.O.:1080; I.V.:600; IV Piggyback:50] Out: 9449 [Urine:1375] Intake/Output this shift: Total I/O In: -  Out: 1 [Stool:1]  General appearance: alert and no distress GI: soft, non-tender; bowel sounds normal; no masses,  no organomegaly Extremities: no axterixis  Lab Results:  Recent Labs  06/17/16 0536 06/18/16 0426 06/19/16 0544  WBC 11.6* 10.9* 16.0*  HGB 8.2* 7.6* 8.6*  HCT 23.7* 22.1* 25.3*  PLT 104* 98* 125*   BMET  Recent Labs  06/17/16 0536 06/18/16 0426 06/19/16 0544  NA 136 135 136  K 3.4* 3.8 4.1  CL 105 108 110  CO2 21* 19* 18*  GLUCOSE 150* 142* 111*  BUN 69* 57* 46*  CREATININE 2.79* 2.49* 2.20*  CALCIUM 7.2* 7.7* 8.3*   LFT  Recent Labs  06/17/16 0536 06/18/16 0426 06/19/16 0544  PROT 5.5* 5.0*  --   ALBUMIN 2.6* 2.3* 2.4*  AST 316* 191*  --   ALT 271* 225*  --   ALKPHOS 89 91  --   BILITOT 28.5* 25.2*  --   BILIDIR 18.6*  --   --   IBILI 9.9*  --   --    PT/INR  Recent Labs  06/18/16 0426 06/19/16 0544  LABPROT 26.4* 24.6*  INR 2.37 2.18   Hepatitis Panel No results for input(s): HEPBSAG, HCVAB, HEPAIGM, HEPBIGM in the last 72 hours. C-Diff No results for input(s): CDIFFTOX in the last 72 hours. Fecal Lactopherrin No results for input(s): FECLLACTOFRN in the last 72 hours.  Studies/Results: No results found.  Medications:  Scheduled: . folic acid  1 mg Oral Daily  . lactulose  20 g Oral TID  . midodrine  10 mg Oral TID WC  . multivitamin with minerals  1  tablet Oral Daily  . pantoprazole  40 mg Intravenous Q12H  . prednisoLONE  40 mg Oral Daily  . rifaximin  550 mg Oral BID  . thiamine  100 mg Oral Daily   Or  . thiamine  100 mg Intravenous Daily   Continuous: . sodium chloride    . cefTRIAXone (ROCEPHIN)  IV Stopped (06/18/16 1755)  . octreotide  (SANDOSTATIN)    IV infusion 50 mcg/hr (06/19/16 0544)    Assessment/Plan: 1) Decompensated cirrhosis - recovering.  INR 2.1 2) HRS - recovering.   I am very encouraged with his progress.  It appears that he will recover to his baseline with his episode of decompensation.  He has completed a week of antibiotic prophylaxis and I will discontinue the medication.  As for his HRS this is the only issue that needs to be resolved.  Octreotide was initially started for the possibility of a variceal bleed, but this issue was ruled out with the EGD.  He was discovered to have HRS and octreotide was maintained.  I will defer to renal for continued management.  HGB increased back up from 7.6 without a blood transfusion.  No need for an EGD.  Plan: 1) Midrodrine and octreotide infusions per Renal.  Octreotide, from the  GI standpoint, can be discontinued if Renal does not feel that it is contributing to his HRS treatment. 2) Resume nutrition.   3) Change pantoprazole to oral.  (Overt evidence of Barrett's.) 4) D/C ceftriaxone.  LOS: 7 days   Jailah Willis D 06/19/2016, 7:58 AM

## 2016-06-19 NOTE — Progress Notes (Signed)
  S:  Feels well, No new CO O:BP 137/76 (BP Location: Left Arm)   Pulse 78   Temp 98.2 F (36.8 C) (Oral)   Resp 20   Ht 5' 10"  (1.778 m)   Wt 126.8 kg (279 lb 9.6 oz)   SpO2 100%   BMI 40.12 kg/m   Intake/Output Summary (Last 24 hours) at 06/19/16 0946 Last data filed at 06/19/16 0730  Gross per 24 hour  Intake             1605 ml  Output             1376 ml  Net              229 ml   Weight change: -0.408 kg (-14.4 oz) UPJ:SRPRX and alert.  + jaundice and scleral icterus CVS:RRR Resp:Clear Abd:+ BS NT + distention, soft Ext: tr edema NEURO:CNI Ox3 no asterixis   . folic acid  1 mg Oral Daily  . lactulose  20 g Oral TID  . midodrine  10 mg Oral TID WC  . multivitamin with minerals  1 tablet Oral Daily  . pantoprazole  40 mg Oral Daily  . prednisoLONE  40 mg Oral Daily  . rifaximin  550 mg Oral BID  . thiamine  100 mg Oral Daily   Or  . thiamine  100 mg Intravenous Daily   No results found. BMET    Component Value Date/Time   NA 136 06/19/2016 0544   K 4.1 06/19/2016 0544   CL 110 06/19/2016 0544   CO2 18 (L) 06/19/2016 0544   GLUCOSE 111 (H) 06/19/2016 0544   BUN 46 (H) 06/19/2016 0544   CREATININE 2.20 (H) 06/19/2016 0544   CALCIUM 8.3 (L) 06/19/2016 0544   GFRNONAA 35 (L) 06/19/2016 0544   GFRAA 40 (L) 06/19/2016 0544   CBC    Component Value Date/Time   WBC 16.0 (H) 06/19/2016 0544   RBC 2.63 (L) 06/19/2016 0544   HGB 8.6 (L) 06/19/2016 0544   HCT 25.3 (L) 06/19/2016 0544   PLT 125 (L) 06/19/2016 0544   MCV 96.2 06/19/2016 0544   MCH 32.7 06/19/2016 0544   MCHC 34.0 06/19/2016 0544   RDW 22.8 (H) 06/19/2016 0544   LYMPHSABS 1.3 01/08/2014 2029   MONOABS 0.8 01/08/2014 2029   EOSABS 0 01/08/2014 2029   BASOSABS 0 01/08/2014 2029     Assessment:  1. Acute Renal failure most likely from HRS.  UO good, Scr cont to trend down  His hyperbilirubinemia puts him at risk for ATN however 2. ETOH cirrhosis 3. Mild Hypokalemia, resolved 4.  Anemia  Plan: 1. My plan would be to stop octreotide when Scr <2 which hopefully would be tomorrow but then will need 1-2d to make sure renal fx is stable post DC of med   2. Agree with Dr Jonnie Finner that HD is not an option and recent studies suggest HD is poor option if pt not a liver Tx candidate    Colin Hodges

## 2016-06-19 NOTE — Progress Notes (Signed)
PROGRESS NOTE                                                                                                                                                                                                             Patient Demographics:    Colin Hodges, is a 46 y.o. male, DOB - 1970-11-21, ZOX:[id number]  Admit date - 06/12/2016   Admitting Physician Carol Ada, MD  Outpatient Primary MD for the patient is Patient, No Pcp Per  LOS - 7  Outpatient Specialists:Dr. Benson Norway (GI)  No chief complaint on file.      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 He reported feeling weak and had some epigastric discomfort. His girlfriend at bedside reports him to be getting confused for last few days. Girlfriend Reported that he has not been drinking since he got hospitalized in 2015. He called Dr. Benson Norway and was hostile, to the hospital. 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 His MELD score was 27 at that time and could not get a TIPS.  On admission he had significant leukocytosis (37K) with a hemoglobin of 7.2 which dropped to 5.5 this morning. Chemistry showed sodium of 129, K of 5.2, chloride 97, CO2 of 11, BUN of 133 and creatinine of 4.77 and INR of 2.84. He had an anion gap of 21. Patient placed on octreotide drip, IV Protonix. aHe also received 2 units FFP on admission along with vitamin K.      Subjective:   No evidence of any bleeding, feels well   Assessment  & Plan :      Decompensated hepatic cirrhosis (HCC)/shock liver/possible EtOH hepatitis With associated upper GI bleed and coagulopathy.. EGD shows nonbleeding portal hypertensive gastropathy, hemorrhagic duodenopathy without active  bleeding. -On octreotide drip for HRS . Continue PPI twice a day. Status post 6 units since admission . Received 2 unit FFP, vitamin K daily 3 days, hemoglobin 8.2>7.6>8.2 -empiric Rocephin for SBP prophylaxis discontinued after 7 days of treatment. MELD score >40. Significant transaminitis possibly due to acute hepatitis/shock liver. LFTs slowly improving but bilirubin remains high. Not a candidate for TIPSS or liver transplant ( last drink was about  6 months ago). Also not a candidate for dialysis. encephalopathy improved. INR 2.37>2.18 AST, ALT continued to improve, bilirubin has improved from 28.5>25.2.     Acute upper GI bleed Hemoglobin stable, no need for repeat EGD as per Dr. Benson Norway  Acute Alcoholic hepatitis Continue oral prednisolone/rifaximin. LFTs slowly improving.  Acute kidney injury with? Hepatorenal syndrome ([place]) Reportedly had normal renal function a few months back. ATN  and metabolic acidosis on presentation. Renal ultrasound without medical renal disease or obstruction. Steady urine output Renal following. HRS protocol initiated. Received IV albumin. Continue midodrine. Bicarbonate drip discontinued. Renal function improving. Patient is not a dialysis candidate. stop octreotide when Scr <2 which hopefully would be tomorrow but then will need 1-2d to make sure renal fx is stable post DC of med    Hyponatremia Secondary to decompensated liver disease versus HRS. resolved.  Hepatic/uremic encephalopathy. Encephalopathy resolved.. Continue rifaximin and lactulose.  Alcohol abuse Reports last drink to be about 6 months back. Stable on CIWA, no signs of withdrawal   Hyperkalemia, now hypokalemic Repleted, magnesium 2.3  Leukocytosis Secondary to acute illness/SBP. Continue empiric Rocephin. Improved.    Code Status : Full code  Family Communication  none at bedside (girlfriend involved in care)   Disposition Plan  : Pending hospital course. Prognosis is  still guarded  Barriers For Discharge : Active symptoms  Consults  :   [facility]  Procedures  :  Renal ultrasound EGD  DVT Prophylaxis  : SCDs/coagulopathy  Lab Results  Component Value Date   PLT 125 (L) 06/19/2016    Antibiotics  :    Anti-infectives    Start     Dose/Rate Route Frequency Ordered Stop   06/13/16 2200  rifaximin (XIFAXAN) tablet 550 mg     550 mg Oral 2 times daily 06/13/16 1738     06/12/16 1800  cefTRIAXone (ROCEPHIN) 1 g in dextrose 5 % 50 mL IVPB  Status:  Discontinued     1 g 100 mL/hr over 30 Minutes Intravenous Every 24 hours 06/12/16 1706 06/19/16 0801        Objective:   Vitals:   06/18/16 2357 06/19/16 0438 06/19/16 0531 06/19/16 0755  BP: 137/60 (!) 102/51  137/76  Pulse: 75 (!) 55  78  Resp: (!) 23 15  20   Temp:  98.2 F (36.8 C)  98.2 F (36.8 C)  TempSrc:  Axillary  Oral  SpO2: 99% 100%  100%  Weight:   126.8 kg (279 lb 9.6 oz)   Height:        Wt Readings from Last 3 Encounters:  06/19/16 126.8 kg (279 lb 9.6 oz)  01/14/14 98.2 kg (216 lb 8 oz)  10/20/11 113.4 kg (250 lb)     Intake/Output Summary (Last 24 hours) at 06/19/16 0947 Last data filed at 06/19/16 0730  Gross per 24 hour  Intake             1605 ml  Output             1376 ml  Net              229 ml     Physical Exam Gen.: Severely jaundiced, not in distress HEENT: Icteric, pallor present, moist mucosa Chest: Clear to auscultation bilaterally  CVS: Normal S1 and S2, no murmurs GI: Soft, mild abdominal distention, nontender Musculoskeletal: Trace pitting edema bilaterally, CNS: Alert and oriented, no tremors    Data Review:    CBC  Recent  Labs Lab 06/16/16 0424 06/16/16 1846 06/17/16 0536 06/18/16 0426 06/19/16 0544  WBC 12.1* 13.4* 11.6* 10.9* 16.0*  HGB 6.7* 8.3* 8.2* 7.6* 8.6*  HCT 18.5* 23.7* 23.7* 22.1* 25.3*  PLT 127* 121* 104* 98* 125*  MCV 93.0 92.2 92.9 94.4 96.2  MCH 33.7 32.3 32.2 32.5 32.7  MCHC 36.2* 35.0 34.6  34.4 34.0  RDW 22.1* 22.2* 23.1* 22.9* 22.8*    Chemistries   Recent Labs Lab 06/14/16 0624  06/15/16 0529 06/16/16 0424 06/17/16 0536 06/17/16 1119 06/18/16 0426 06/19/16 0544  NA 131*  < > 134* 137 136  --  135 136  K 3.6  < > 3.4* 3.2* 3.4*  --  3.8 4.1  CL 98*  < > 99* 103 105  --  108 110  CO2 16*  < > 22 23 21*  --  19* 18*  GLUCOSE 133*  < > 153* 149* 150*  --  142* 111*  BUN 148*  < > 117* 93* 69*  --  57* 46*  CREATININE 4.53*  < > 3.93* 3.36* 2.79*  --  2.49* 2.20*  CALCIUM 6.0*  < > 6.5* 6.8* 7.2*  --  7.7* 8.3*  MG  --   --   --   --   --  2.3  --   --   AST 1,269*  --  809* 508* 316*  --  191*  --   ALT 499*  --  432* 318* 271*  --  225*  --   ALKPHOS 72  --  71 65 89  --  91  --   BILITOT 25.3*  --  27.6* 27.5* 28.5*  --  25.2*  --   < > = values in this interval not displayed. ------------------------------------------------------------------------------------------------------------------ No results for input(s): CHOL, HDL, LDLCALC, TRIG, CHOLHDL, LDLDIRECT in the last 72 hours.  No results found for: HGBA1C ------------------------------------------------------------------------------------------------------------------ No results for input(s): TSH, T4TOTAL, T3FREE, THYROIDAB in the last 72 hours.  Invalid input(s): FREET3 ------------------------------------------------------------------------------------------------------------------ No results for input(s): VITAMINB12, FOLATE, FERRITIN, TIBC, IRON, RETICCTPCT in the last 72 hours.  Coagulation profile  Recent Labs Lab 06/15/16 0529 06/16/16 0424 06/17/16 0536 06/18/16 0426 06/19/16 0544  INR 2.68 2.52 2.49 2.37 2.18    No results for input(s): DDIMER in the last 72 hours.  Cardiac Enzymes No results for input(s): CKMB, TROPONINI, MYOGLOBIN in the last 168 hours.  Invalid input(s):  CK ------------------------------------------------------------------------------------------------------------------ No results found for: BNP  Inpatient Medications  Scheduled Meds: . folic acid  1 mg Oral Daily  . lactulose  20 g Oral TID  . midodrine  10 mg Oral TID WC  . multivitamin with minerals  1 tablet Oral Daily  . pantoprazole  40 mg Oral Daily  . prednisoLONE  40 mg Oral Daily  . rifaximin  550 mg Oral BID  . thiamine  100 mg Oral Daily   Or  . thiamine  100 mg Intravenous Daily   Continuous Infusions: . sodium chloride    . octreotide  (SANDOSTATIN)    IV infusion 50 mcg/hr (06/19/16 0544)   PRN Meds:.oxymetazoline, sodium chloride flush  Micro Results Recent Results (from the past 240 hour(s))  MRSA PCR Screening     Status: None   Collection Time: 06/12/16  5:12 PM  Result Value Ref Range Status   MRSA by PCR NEGATIVE NEGATIVE Final    Comment:        The GeneXpert MRSA Assay (FDA approved for NASAL specimens only), is  one component of a comprehensive MRSA colonization surveillance program. It is not intended to diagnose MRSA infection nor to guide or monitor treatment for MRSA infections.     Radiology Reports US Abdomen Complete  Result Date: 06/02/2016 CLINICAL DATA:  Jaundice EXAM: ABDOMEN ULTRASOUND COMPLETE COMPARISON:  01/09/2014 FINDINGS: Gallbladder: Gallbladder is well distended with gallbladder wall thickening to 7.4 mm. Multiple calculi are noted. A negative sonographic Murphy's sign is noted. Common bile duct: Diameter: 5.4 mm. Liver: Diffusely increased in echogenicity consistent with fatty infiltration. This is similar to that seen on the prior CT examination. The liver is also enlarged in size. Phasic flow is noted within the portal vein with a to and fro component. IVC: No abnormality visualized. Pancreas: Visualized portion unremarkable. Spleen: Splenomegaly is noted. Splenic volume is approximately 1,500 cubic cm. Perisplenic varices  are again noted. Right Kidney: Length: 14.3 cm. Echogenicity within normal limits. No mass or hydronephrosis visualized. Left Kidney: Length: 14.9 cm. Echogenicity within normal limits. No mass or hydronephrosis visualized. Abdominal aorta: No aneurysm visualized. Other findings: Mild ascites is noted. IMPRESSION: Gallbladder is well distended with evidence of cholelithiasis and gallbladder wall thickening. The wall thickening may be in part due to the underlying ascites. No sonographic Percell Miller sign is noted. Hepatosplenomegaly with diffuse fatty infiltration of the liver similar to that seen on prior CT examination. Perisplenic varices consistent with a degree of portal hypertension. Electronically Signed   By: Inez Catalina M.D.   On: 06/02/2016 10:05   US Renal  Result Date: 06/13/2016 CLINICAL DATA:  Acute kidney injury EXAM: RENAL / URINARY TRACT ULTRASOUND COMPLETE COMPARISON:  None. FINDINGS: Right Kidney: Length: 15.5 cm. Echogenicity within normal limits. No mass or hydronephrosis visualized. Left Kidney: Length: 15.5 cm. Echogenicity within normal limits. No mass or hydronephrosis visualized. Bladder: Appears normal for degree of bladder distention. Peritoneal ascites is visible in the perihepatic space. IMPRESSION: Normal kidneys and bladder.  Peritoneal ascites noted. Electronically Signed   By: Andreas Newport M.D.   On: 06/13/2016 06:20    Time Spent in minutes 35   Reyne Dumas M.D on 06/19/2016 at 9:47 AM  Between 7am to 7pm - Pager - [phone]  After 7pm go to [email or web address] - password [email or web address]  [facility] -  Office  [phone]

## 2016-06-20 DIAGNOSIS — D689 Coagulation defect, unspecified: Secondary | ICD-10-CM

## 2016-06-20 DIAGNOSIS — K766 Portal hypertension: Secondary | ICD-10-CM

## 2016-06-20 DIAGNOSIS — K7010 Alcoholic hepatitis without ascites: Secondary | ICD-10-CM

## 2016-06-20 LAB — COMPREHENSIVE METABOLIC PANEL
ALK PHOS: 82 U/L (ref 38–126)
ALT: 154 U/L — ABNORMAL HIGH (ref 17–63)
AST: 104 U/L — ABNORMAL HIGH (ref 15–41)
Albumin: 2.1 g/dL — ABNORMAL LOW (ref 3.5–5.0)
Anion gap: 8 (ref 5–15)
BILIRUBIN TOTAL: 22 mg/dL — AB (ref 0.3–1.2)
BUN: 43 mg/dL — ABNORMAL HIGH (ref 6–20)
CALCIUM: 8.1 mg/dL — AB (ref 8.9–10.3)
CO2: 17 mmol/L — AB (ref 22–32)
CREATININE: 1.97 mg/dL — AB (ref 0.61–1.24)
Chloride: 111 mmol/L (ref 101–111)
GFR, EST AFRICAN AMERICAN: 46 mL/min — AB (ref >60)
GFR, EST NON AFRICAN AMERICAN: 40 mL/min — AB (ref >60)
Glucose, Bld: 122 mg/dL — ABNORMAL HIGH (ref 65–99)
Potassium: 4.6 mmol/L (ref 3.5–5.1)
Sodium: 136 mmol/L (ref 135–145)
TOTAL PROTEIN: 5 g/dL — AB (ref 6.5–8.1)

## 2016-06-20 LAB — PROTIME-INR
INR: 2.31
Prothrombin Time: 25.8 seconds — ABNORMAL HIGH (ref 11.4–15.2)

## 2016-06-20 NOTE — Progress Notes (Signed)
GI ATTENDING (covering for Dr. Benson Norway)  Patient's case history reviewed with Dr. Benson Norway last evening. Nephrology input from today reviewed. Laboratories reviewed. Patient is stable. Labs continue to trend a good direction. No new recommendations from GI standpoint.  Docia Chuck. Geri Seminole., M.D. [facility] of Gastroenterology

## 2016-06-20 NOTE — Progress Notes (Signed)
PROGRESS NOTE                                                                                                                                                                                                             Patient Demographics:    Colin Hodges, is a 46 y.o. male, DOB - [birth year], IDP:[id number]  Admit date - 06/12/2016   Admitting Physician Carol Ada, MD  Outpatient Primary MD for the patient is Patient, No Pcp Per  LOS - 8  Outpatient Specialists:Dr. Benson Norway (GI)  No chief complaint on file.      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 He reported feeling weak and had some epigastric discomfort. His girlfriend at bedside reports him to be getting confused for last few days. Girlfriend Reported that he has not been drinking since he got hospitalized in 2015. He called Dr. Benson Norway and was hostile, to the hospital. 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 His MELD score was 27 at that time and could not get a TIPS.  On admission he had significant leukocytosis (37K) with a hemoglobin of 7.2 which dropped to 5.5 this morning. Chemistry showed sodium of 129, K of 5.2, chloride 97, CO2 of 11, BUN of 133 and creatinine of 4.77 and INR of 2.84. He had an anion gap of 21. Patient placed on octreotide drip, IV Protonix. aHe also received 2 units FFP on admission along with vitamin K.      Subjective:   No evidence of any bleeding, feels  bloated    Assessment  & Plan :      Decompensated hepatic cirrhosis (HCC)/shock liver/possible EtOH hepatitis With associated upper GI bleed and coagulopathy.. EGD shows nonbleeding portal hypertensive gastropathy, hemorrhagic duodenopathy without  active bleeding. -On octreotide drip for HRS, see management as below . Continue PPI twice a day. Status post 6 units since admission . Received 2 unit FFP, vitamin K daily 3 days, hemoglobin 8.2>7.6>8.2 -empiric Rocephin for SBP prophylaxis discontinued after 7 days of treatment. MELD score >40. Significant transaminitis possibly due to acute hepatitis/shock liver. LFTs slowly improving but bilirubin remains high. Not a candidate for TIPSS or liver  transplant ( last drink was about 6 months ago). Also not a candidate for dialysis. encephalopathy improved. INR 2.37>2.18 AST, ALT continued to improve, bilirubin has improved from 28.5>25.2.     Acute upper GI bleed Hemoglobin stable, no need for repeat EGD as per Dr. Benson Norway  Acute Alcoholic hepatitis Continue oral prednisolone/rifaximin. LFTs slowly improving.  Acute kidney injury with? Hepatorenal syndrome ([place]) Reportedly had normal renal function a few months back. ATN  and metabolic acidosis on presentation. Renal ultrasound without medical renal disease or obstruction. Steady urine output Renal following. HRS protocol initiated. Received IV albumin. Continue midodrine. Bicarbonate drip discontinued. Renal function improving. Patient is not a dialysis candidate. stopped octreotide as Scr <2 as per nephrology recommendations, will need 1-2d to make sure renal fx is stable post DC of med    Hyponatremia Secondary to decompensated liver disease versus HRS. resolved.  Hepatic/uremic encephalopathy. Encephalopathy resolved.. Continue rifaximin and lactulose.  Alcohol abuse Reports last drink to be about 6 months back. Stable on CIWA, no signs of withdrawal   Hyperkalemia, now hypokalemic Repleted, magnesium 2.3  Leukocytosis Probably secondary to steroids, doubt SBP. Continue empiric Rocephin. Improved.    Code Status : Full code  Family Communication  none at bedside (girlfriend involved in care)   Disposition Plan  :  Anticipate discharge tomorrow if LFT and renal function remained stable  Barriers For Discharge : Active symptoms  Consults  :   [facility]  Procedures  :  Renal ultrasound EGD  DVT Prophylaxis  : SCDs/coagulopathy  Lab Results  Component Value Date   PLT 125 (L) 06/19/2016    Antibiotics  :    Anti-infectives    Start     Dose/Rate Route Frequency Ordered Stop   06/13/16 2200  rifaximin (XIFAXAN) tablet 550 mg     550 mg Oral 2 times daily 06/13/16 1738     06/12/16 1800  cefTRIAXone (ROCEPHIN) 1 g in dextrose 5 % 50 mL IVPB  Status:  Discontinued     1 g 100 mL/hr over 30 Minutes Intravenous Every 24 hours 06/12/16 1706 06/19/16 0801        Objective:   Vitals:   06/19/16 1150 06/19/16 1541 06/19/16 2139 06/20/16 0541  BP: 121/60 (!) 142/75 123/61 (!) 115/53  Pulse: 71 72 (!) 59 68  Resp: 17 20 20 18   Temp: 98.1 F (36.7 C) 98.2 F (36.8 C) 98.5 F (36.9 C) 98.2 F (36.8 C)  TempSrc: Oral   Oral  SpO2: 98% 99% 100% 100%  Weight:  127 kg (280 lb)  128.3 kg (282 lb 14.4 oz)  Height:  5' 10"  (1.778 m)      Wt Readings from Last 3 Encounters:  06/20/16 128.3 kg (282 lb 14.4 oz)  01/14/14 98.2 kg (216 lb 8 oz)  10/20/11 113.4 kg (250 lb)     Intake/Output Summary (Last 24 hours) at 06/20/16 0950 Last data filed at 06/20/16 2202  Gross per 24 hour  Intake              410 ml  Output              725 ml  Net             -315 ml     Physical Exam Gen.: Severely jaundiced, not in distress HEENT: Icteric, pallor present, moist mucosa Chest: Clear to auscultation bilaterally  CVS: Normal S1 and S2, no murmurs GI: Soft, mild abdominal distention, nontender Musculoskeletal:  Trace pitting edema bilaterally, CNS: Alert and oriented, no tremors    Data Review:    CBC  Recent Labs Lab 06/16/16 0424 06/16/16 1846 06/17/16 0536 06/18/16 0426 06/19/16 0544  WBC 12.1* 13.4* 11.6* 10.9* 16.0*  HGB 6.7* 8.3* 8.2* 7.6* 8.6*  HCT 18.5*  23.7* 23.7* 22.1* 25.3*  PLT 127* 121* 104* 98* 125*  MCV 93.0 92.2 92.9 94.4 96.2  MCH 33.7 32.3 32.2 32.5 32.7  MCHC 36.2* 35.0 34.6 34.4 34.0  RDW 22.1* 22.2* 23.1* 22.9* 22.8*    Chemistries   Recent Labs Lab 06/16/16 0424 06/17/16 0536 06/17/16 1119 06/18/16 0426 06/19/16 0544 06/19/16 0956 06/20/16 0436  NA 137 136  --  135 136  --  136  K 3.2* 3.4*  --  3.8 4.1  --  4.6  CL 103 105  --  108 110  --  111  CO2 23 21*  --  19* 18*  --  17*  GLUCOSE 149* 150*  --  142* 111*  --  122*  BUN 93* 69*  --  57* 46*  --  43*  CREATININE 3.36* 2.79*  --  2.49* 2.20*  --  1.97*  CALCIUM 6.8* 7.2*  --  7.7* 8.3*  --  8.1*  MG  --   --  2.3  --   --   --   --   AST 508* 316*  --  191*  --  141* 104*  ALT 318* 271*  --  225*  --  192* 154*  ALKPHOS 65 89  --  91  --  104 82  BILITOT 27.5* 28.5*  --  25.2*  --  25.3* 22.0*   ------------------------------------------------------------------------------------------------------------------ No results for input(s): CHOL, HDL, LDLCALC, TRIG, CHOLHDL, LDLDIRECT in the last 72 hours.  No results found for: HGBA1C ------------------------------------------------------------------------------------------------------------------ No results for input(s): TSH, T4TOTAL, T3FREE, THYROIDAB in the last 72 hours.  Invalid input(s): FREET3 ------------------------------------------------------------------------------------------------------------------ No results for input(s): VITAMINB12, FOLATE, FERRITIN, TIBC, IRON, RETICCTPCT in the last 72 hours.  Coagulation profile  Recent Labs Lab 06/16/16 0424 06/17/16 0536 06/18/16 0426 06/19/16 0544 06/20/16 0436  INR 2.52 2.49 2.37 2.18 2.31    No results for input(s): DDIMER in the last 72 hours.  Cardiac Enzymes No results for input(s): CKMB, TROPONINI, MYOGLOBIN in the last 168 hours.  Invalid input(s):  CK ------------------------------------------------------------------------------------------------------------------ No results found for: BNP  Inpatient Medications  Scheduled Meds: . folic acid  1 mg Oral Daily  . lactulose  20 g Oral TID  . midodrine  10 mg Oral TID WC  . multivitamin with minerals  1 tablet Oral Daily  . pantoprazole  40 mg Oral Daily  . prednisoLONE  40 mg Oral Daily  . rifaximin  550 mg Oral BID  . thiamine  100 mg Oral Daily   Or  . thiamine  100 mg Intravenous Daily   Continuous Infusions: . sodium chloride     PRN Meds:.oxymetazoline, sodium chloride flush  Micro Results Recent Results (from the past 240 hour(s))  MRSA PCR Screening     Status: None   Collection Time: 06/12/16  5:12 PM  Result Value Ref Range Status   MRSA by PCR NEGATIVE NEGATIVE Final    Comment:        The GeneXpert MRSA Assay (FDA approved for NASAL specimens only), is one component of a comprehensive MRSA colonization surveillance program. It is not intended to diagnose MRSA infection nor to guide or monitor treatment for MRSA  infections.     Radiology Reports US Abdomen Complete  Result Date: 06/02/2016 CLINICAL DATA:  Jaundice EXAM: ABDOMEN ULTRASOUND COMPLETE COMPARISON:  01/09/2014 FINDINGS: Gallbladder: Gallbladder is well distended with gallbladder wall thickening to 7.4 mm. Multiple calculi are noted. A negative sonographic Murphy's sign is noted. Common bile duct: Diameter: 5.4 mm. Liver: Diffusely increased in echogenicity consistent with fatty infiltration. This is similar to that seen on the prior CT examination. The liver is also enlarged in size. Phasic flow is noted within the portal vein with a to and fro component. IVC: No abnormality visualized. Pancreas: Visualized portion unremarkable. Spleen: Splenomegaly is noted. Splenic volume is approximately 1,500 cubic cm. Perisplenic varices are again noted. Right Kidney: Length: 14.3 cm. Echogenicity within  normal limits. No mass or hydronephrosis visualized. Left Kidney: Length: 14.9 cm. Echogenicity within normal limits. No mass or hydronephrosis visualized. Abdominal aorta: No aneurysm visualized. Other findings: Mild ascites is noted. IMPRESSION: Gallbladder is well distended with evidence of cholelithiasis and gallbladder wall thickening. The wall thickening may be in part due to the underlying ascites. No sonographic Percell Miller sign is noted. Hepatosplenomegaly with diffuse fatty infiltration of the liver similar to that seen on prior CT examination. Perisplenic varices consistent with a degree of portal hypertension. Electronically Signed   By: Inez Catalina M.D.   On: 06/02/2016 10:05   US Renal  Result Date: 06/13/2016 CLINICAL DATA:  Acute kidney injury EXAM: RENAL / URINARY TRACT ULTRASOUND COMPLETE COMPARISON:  None. FINDINGS: Right Kidney: Length: 15.5 cm. Echogenicity within normal limits. No mass or hydronephrosis visualized. Left Kidney: Length: 15.5 cm. Echogenicity within normal limits. No mass or hydronephrosis visualized. Bladder: Appears normal for degree of bladder distention. Peritoneal ascites is visible in the perihepatic space. IMPRESSION: Normal kidneys and bladder.  Peritoneal ascites noted. Electronically Signed   By: Andreas Newport M.D.   On: 06/13/2016 06:20    Time Spent in minutes 35   Reyne Dumas M.D on 06/20/2016 at 9:50 AM  Between 7am to 7pm - Pager - [phone]  After 7pm go to [email or web address] - password [email or web address]  [facility] -  Office  [phone]

## 2016-06-20 NOTE — Progress Notes (Signed)
  S:  Feels well, No new CO   Says all urine was not collected O:BP (!) 115/53 (BP Location: Left Arm)   Pulse 68   Temp 98.2 F (36.8 C) (Oral)   Resp 18   Ht 5' 10"  (1.778 m)   Wt 128.3 kg (282 lb 14.4 oz)   SpO2 100%   BMI 40.59 kg/m   Intake/Output Summary (Last 24 hours) at 06/20/16 1022 Last data filed at 06/20/16 0956  Gross per 24 hour  Intake           682.08 ml  Output              725 ml  Net           -42.92 ml   Weight change: 0.181 kg (6.4 oz) OFH:QRFXJ and alert.  + jaundice and scleral icterus CVS:RRR Resp:Clear Abd:+ BS NT + distention, soft Ext: tr edema NEURO:CNI Ox3 no asterixis   . folic acid  1 mg Oral Daily  . lactulose  20 g Oral TID  . midodrine  10 mg Oral TID WC  . multivitamin with minerals  1 tablet Oral Daily  . pantoprazole  40 mg Oral Daily  . prednisoLONE  40 mg Oral Daily  . rifaximin  550 mg Oral BID  . thiamine  100 mg Oral Daily   Or  . thiamine  100 mg Intravenous Daily   No results found. BMET    Component Value Date/Time   NA 136 06/20/2016 0436   K 4.6 06/20/2016 0436   CL 111 06/20/2016 0436   CO2 17 (L) 06/20/2016 0436   GLUCOSE 122 (H) 06/20/2016 0436   BUN 43 (H) 06/20/2016 0436   CREATININE 1.97 (H) 06/20/2016 0436   CALCIUM 8.1 (L) 06/20/2016 0436   GFRNONAA 40 (L) 06/20/2016 0436   GFRAA 46 (L) 06/20/2016 0436   CBC    Component Value Date/Time   WBC 16.0 (H) 06/19/2016 0544   RBC 2.63 (L) 06/19/2016 0544   HGB 8.6 (L) 06/19/2016 0544   HCT 25.3 (L) 06/19/2016 0544   PLT 125 (L) 06/19/2016 0544   MCV 96.2 06/19/2016 0544   MCH 32.7 06/19/2016 0544   MCHC 34.0 06/19/2016 0544   RDW 22.8 (H) 06/19/2016 0544   LYMPHSABS 1.3 01/08/2014 2029   MONOABS 0.8 01/08/2014 2029   EOSABS 0 01/08/2014 2029   BASOSABS 0 01/08/2014 2029     Assessment:  1. Acute Renal failure most likely from HRS.  UO incomplete per pt , Scr cont to trend down  His hyperbilirubinemia puts him at risk for ATN however 2. ETOH  cirrhosis 3. Mild Hypokalemia, resolved 4. Anemia  Plan: 1. DC octreotide when current bag is done 2. Agree with Dr Jonnie Finner that HD is not an option and recent studies suggest HD is poor option if pt not a liver Tx candidate    Colin Hodges T

## 2016-06-21 LAB — CBC
HCT: 23.9 % — ABNORMAL LOW (ref 39.0–52.0)
Hemoglobin: 8.2 g/dL — ABNORMAL LOW (ref 13.0–17.0)
MCH: 33.5 pg (ref 26.0–34.0)
MCHC: 34.3 g/dL (ref 30.0–36.0)
MCV: 97.6 fL (ref 78.0–100.0)
Platelets: 88 10*3/uL — ABNORMAL LOW (ref 150–400)
RBC: 2.45 MIL/uL — ABNORMAL LOW (ref 4.22–5.81)
RDW: 22.1 % — ABNORMAL HIGH (ref 11.5–15.5)
WBC: 13.1 10*3/uL — ABNORMAL HIGH (ref 4.0–10.5)

## 2016-06-21 LAB — COMPREHENSIVE METABOLIC PANEL
ALT: 137 U/L — AB (ref 17–63)
AST: 80 U/L — AB (ref 15–41)
Albumin: 2.1 g/dL — ABNORMAL LOW (ref 3.5–5.0)
Alkaline Phosphatase: 91 U/L (ref 38–126)
Anion gap: 7 (ref 5–15)
BILIRUBIN TOTAL: 20 mg/dL — AB (ref 0.3–1.2)
BUN: 38 mg/dL — AB (ref 6–20)
CO2: 17 mmol/L — ABNORMAL LOW (ref 22–32)
CREATININE: 1.75 mg/dL — AB (ref 0.61–1.24)
Calcium: 8.3 mg/dL — ABNORMAL LOW (ref 8.9–10.3)
Chloride: 110 mmol/L (ref 101–111)
GFR calc Af Amer: 53 mL/min — ABNORMAL LOW (ref >60)
GFR, EST NON AFRICAN AMERICAN: 46 mL/min — AB (ref >60)
Glucose, Bld: 121 mg/dL — ABNORMAL HIGH (ref 65–99)
Potassium: 4.6 mmol/L (ref 3.5–5.1)
SODIUM: 134 mmol/L — AB (ref 135–145)
Total Protein: 5.1 g/dL — ABNORMAL LOW (ref 6.5–8.1)

## 2016-06-21 LAB — AMMONIA: Ammonia: 74 umol/L — ABNORMAL HIGH (ref 9–35)

## 2016-06-21 LAB — PROTIME-INR
INR: 2.2
Prothrombin Time: 24.8 seconds — ABNORMAL HIGH (ref 11.4–15.2)

## 2016-06-21 NOTE — Progress Notes (Signed)
PROGRESS NOTE                                                                                                                                                                                                             Patient Demographics:    Colin Hodges, is a 46 y.o. male, DOB - 08-14-1970, ZOX:[id number]  Admit date - 06/12/2016   Admitting Physician Carol Ada, MD  Outpatient Primary MD for the patient is Patient, No Pcp Per  LOS - 9  Outpatient Specialists:Dr. Benson Norway (GI)  No chief complaint on file.      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 He reported feeling weak and had some epigastric discomfort. His girlfriend at bedside reports him to be getting confused for last few days. Girlfriend Reported that he has not been drinking since he got hospitalized in 2015. He called Dr. Benson Norway and was hostile, to the hospital. 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 His MELD score was 27 at that time and could not get a TIPS.  On admission he had significant leukocytosis (37K) with a hemoglobin of 7.2 which dropped to 5.5 this morning. Chemistry showed sodium of 129, K of 5.2, chloride 97, CO2 of 11, BUN of 133 and creatinine of 4.77 and INR of 2.84. He had an anion gap of 21. Patient placed on octreotide drip, IV Protonix. aHe also received 2 units FFP on admission along with vitamin K.      Subjective:   No evidence of any bleeding, otherwise stable, less jaundiced   Assessment  & Plan :      Decompensated hepatic cirrhosis (HCC)/shock liver/possible EtOH hepatitis With associated upper GI bleed and coagulopathy.. EGD shows nonbleeding portal hypertensive gastropathy, hemorrhagic  duodenopathy without active bleeding. -Off octreotide drip for HRS, see management as below . Continue PPI twice a day. Status post 6 units since admission . Received 2 unit FFP, vitamin K daily 3 days, hemoglobin 8.2>7.6>8.2 -empiric Rocephin for SBP prophylaxis discontinued after 7 days of treatment. MELD score >40. Significant transaminitis possibly due to acute hepatitis/shock liver. LFTs slowly improving but bilirubin remains high. Not a candidate for TIPSS or liver  transplant ( last drink was about 6 months ago). Also not a candidate for dialysis. encephalopathy improved. INR 2.37>2.18 AST, ALT  stable for 2 days, bilirubin continues to trend down but still at 20 Continue prednisolone, duration to be specified by GI    Acute upper GI bleed Hemoglobin stable, no need for repeat EGD as per Dr. Benson Norway  Acute Alcoholic hepatitis Continue oral prednisolone/rifaximin. LFTs slowly improving.  Acute kidney injury with? Hepatorenal syndrome ([place]) Reportedly had normal renal function a few months back. ATN  and metabolic acidosis on presentation. Renal ultrasound without medical renal disease or obstruction. Renal following. HRS protocol initiated. Received IV albumin. Continue midodrine. Bicarbonate , octreotide Renal function improving. Patient is not a dialysis candidate. stopped octreotide as Scr <2 as per nephrology recommendations, will need 1-2d to make sure renal fx is stable post DC of med   Patient continues to be at high risk for ATN secondary to hyperbilirubinemia Will follow renal function today and hopefully discharge home tomorrow  Hyponatremia Secondary to decompensated liver disease versus HRS. resolved.  Hepatic/uremic encephalopathy. Encephalopathy resolved.. Continue rifaximin and lactulose.  Alcohol abuse Reports last drink to be about 6 months back. Stable on CIWA, no signs of withdrawal   Hyperkalemia, now hypokalemic Repleted, magnesium  2.3  Leukocytosis Probably secondary to steroids, doubt SBP. Completed empiric Rocephin.  Improving    Code Status : Full code  Family Communication  none at bedside (girlfriend involved in care)   Disposition Plan  : Anticipate discharge  5/14 if renal function and liver function are stable  Barriers For Discharge : Active symptoms  Consults  :   [facility]  Procedures  :  Renal ultrasound EGD  DVT Prophylaxis  : SCDs/coagulopathy  Lab Results  Component Value Date   PLT 88 (L) 06/21/2016    Antibiotics  :    Anti-infectives    Start     Dose/Rate Route Frequency Ordered Stop   06/13/16 2200  rifaximin (XIFAXAN) tablet 550 mg     550 mg Oral 2 times daily 06/13/16 1738     06/12/16 1800  cefTRIAXone (ROCEPHIN) 1 g in dextrose 5 % 50 mL IVPB  Status:  Discontinued     1 g 100 mL/hr over 30 Minutes Intravenous Every 24 hours 06/12/16 1706 06/19/16 0801        Objective:   Vitals:   06/20/16 0541 06/20/16 1433 06/20/16 2226 06/21/16 0614  BP: (!) 115/53 (!) 149/70 120/67 128/62  Pulse: 68 66 66 65  Resp: 18 18 20 18   Temp: 98.2 F (36.8 C) 98.3 F (36.8 C) 98.4 F (36.9 C) 98 F (36.7 C)  TempSrc: Oral  Oral Oral  SpO2: 100% 100% 100% 100%  Weight: 128.3 kg (282 lb 14.4 oz)     Height:        Wt Readings from Last 3 Encounters:  06/20/16 128.3 kg (282 lb 14.4 oz)  01/14/14 98.2 kg (216 lb 8 oz)  10/20/11 113.4 kg (250 lb)     Intake/Output Summary (Last 24 hours) at 06/21/16 1015 Last data filed at 06/20/16 1507  Gross per 24 hour  Intake            267.5 ml  Output                0 ml  Net            267.5 ml     Physical Exam Gen.: Severely jaundiced, not in  distress HEENT: Icteric, pallor present, moist mucosa Chest: Clear to auscultation bilaterally  CVS: Normal S1 and S2, no murmurs GI: Soft, mild abdominal distention, nontender Musculoskeletal: Trace pitting edema bilaterally, CNS: Alert and oriented, no tremors     Data Review:    CBC  Recent Labs Lab 06/16/16 1846 06/17/16 0536 06/18/16 0426 06/19/16 0544 06/21/16 0428  WBC 13.4* 11.6* 10.9* 16.0* 13.1*  HGB 8.3* 8.2* 7.6* 8.6* 8.2*  HCT 23.7* 23.7* 22.1* 25.3* 23.9*  PLT 121* 104* 98* 125* 88*  MCV 92.2 92.9 94.4 96.2 97.6  MCH 32.3 32.2 32.5 32.7 33.5  MCHC 35.0 34.6 34.4 34.0 34.3  RDW 22.2* 23.1* 22.9* 22.8* 22.1*    Chemistries   Recent Labs Lab 06/17/16 0536 06/17/16 1119 06/18/16 0426 06/19/16 0544 06/19/16 0956 06/20/16 0436 06/21/16 0428  NA 136  --  135 136  --  136 134*  K 3.4*  --  3.8 4.1  --  4.6 4.6  CL 105  --  108 110  --  111 110  CO2 21*  --  19* 18*  --  17* 17*  GLUCOSE 150*  --  142* 111*  --  122* 121*  BUN 69*  --  57* 46*  --  43* 38*  CREATININE 2.79*  --  2.49* 2.20*  --  1.97* 1.75*  CALCIUM 7.2*  --  7.7* 8.3*  --  8.1* 8.3*  MG  --  2.3  --   --   --   --   --   AST 316*  --  191*  --  141* 104* 80*  ALT 271*  --  225*  --  192* 154* 137*  ALKPHOS 89  --  91  --  104 82 91  BILITOT 28.5*  --  25.2*  --  25.3* 22.0* 20.0*   ------------------------------------------------------------------------------------------------------------------ No results for input(s): CHOL, HDL, LDLCALC, TRIG, CHOLHDL, LDLDIRECT in the last 72 hours.  No results found for: HGBA1C ------------------------------------------------------------------------------------------------------------------ No results for input(s): TSH, T4TOTAL, T3FREE, THYROIDAB in the last 72 hours.  Invalid input(s): FREET3 ------------------------------------------------------------------------------------------------------------------ No results for input(s): VITAMINB12, FOLATE, FERRITIN, TIBC, IRON, RETICCTPCT in the last 72 hours.  Coagulation profile  Recent Labs Lab 06/17/16 0536 06/18/16 0426 06/19/16 0544 06/20/16 0436 06/21/16 0428  INR 2.49 2.37 2.18 2.31 2.20    No results for input(s): DDIMER in the last 72  hours.  Cardiac Enzymes No results for input(s): CKMB, TROPONINI, MYOGLOBIN in the last 168 hours.  Invalid input(s): CK ------------------------------------------------------------------------------------------------------------------ No results found for: BNP  Inpatient Medications  Scheduled Meds: . folic acid  1 mg Oral Daily  . lactulose  20 g Oral TID  . midodrine  10 mg Oral TID WC  . multivitamin with minerals  1 tablet Oral Daily  . pantoprazole  40 mg Oral Daily  . prednisoLONE  40 mg Oral Daily  . rifaximin  550 mg Oral BID  . thiamine  100 mg Oral Daily   Or  . thiamine  100 mg Intravenous Daily   Continuous Infusions: . sodium chloride     PRN Meds:.oxymetazoline, sodium chloride flush  Micro Results Recent Results (from the past 240 hour(s))  MRSA PCR Screening     Status: None   Collection Time: 06/12/16  5:12 PM  Result Value Ref Range Status   MRSA by PCR NEGATIVE NEGATIVE Final    Comment:        The GeneXpert MRSA Assay (FDA approved for NASAL specimens  only), is one component of a comprehensive MRSA colonization surveillance program. It is not intended to diagnose MRSA infection nor to guide or monitor treatment for MRSA infections.     Radiology Reports US Abdomen Complete  Result Date: 06/02/2016 CLINICAL DATA:  Jaundice EXAM: ABDOMEN ULTRASOUND COMPLETE COMPARISON:  01/09/2014 FINDINGS: Gallbladder: Gallbladder is well distended with gallbladder wall thickening to 7.4 mm. Multiple calculi are noted. A negative sonographic Murphy's sign is noted. Common bile duct: Diameter: 5.4 mm. Liver: Diffusely increased in echogenicity consistent with fatty infiltration. This is similar to that seen on the prior CT examination. The liver is also enlarged in size. Phasic flow is noted within the portal vein with a to and fro component. IVC: No abnormality visualized. Pancreas: Visualized portion unremarkable. Spleen: Splenomegaly is noted. Splenic volume is  approximately 1,500 cubic cm. Perisplenic varices are again noted. Right Kidney: Length: 14.3 cm. Echogenicity within normal limits. No mass or hydronephrosis visualized. Left Kidney: Length: 14.9 cm. Echogenicity within normal limits. No mass or hydronephrosis visualized. Abdominal aorta: No aneurysm visualized. Other findings: Mild ascites is noted. IMPRESSION: Gallbladder is well distended with evidence of cholelithiasis and gallbladder wall thickening. The wall thickening may be in part due to the underlying ascites. No sonographic Percell Miller sign is noted. Hepatosplenomegaly with diffuse fatty infiltration of the liver similar to that seen on prior CT examination. Perisplenic varices consistent with a degree of portal hypertension. Electronically Signed   By: Inez Catalina M.D.   On: 06/02/2016 10:05   US Renal  Result Date: 06/13/2016 CLINICAL DATA:  Acute kidney injury EXAM: RENAL / URINARY TRACT ULTRASOUND COMPLETE COMPARISON:  None. FINDINGS: Right Kidney: Length: 15.5 cm. Echogenicity within normal limits. No mass or hydronephrosis visualized. Left Kidney: Length: 15.5 cm. Echogenicity within normal limits. No mass or hydronephrosis visualized. Bladder: Appears normal for degree of bladder distention. Peritoneal ascites is visible in the perihepatic space. IMPRESSION: Normal kidneys and bladder.  Peritoneal ascites noted. Electronically Signed   By: Andreas Newport M.D.   On: 06/13/2016 06:20    Time Spent in minutes 35   Reyne Dumas M.D on 06/21/2016 at 10:15 AM  Between 7am to 7pm - Pager - [phone]  After 7pm go to [email or web address] - password [facility]  [facility] -  Office  [phone]

## 2016-06-21 NOTE — Progress Notes (Signed)
  S:  No new CO O:BP 128/62 (BP Location: Left Arm)   Pulse 65   Temp 98 F (36.7 C) (Oral)   Resp 18   Ht 5' 10"  (1.778 m)   Wt 128.3 kg (282 lb 14.4 oz)   SpO2 100%   BMI 40.59 kg/m   Intake/Output Summary (Last 24 hours) at 06/21/16 0956 Last data filed at 06/20/16 1507  Gross per 24 hour  Intake            267.5 ml  Output                0 ml  Net            267.5 ml   Weight change:  MVH:QIONG and alert.  + jaundice and scleral icterus CVS:RRR Resp:Clear Abd:+ BS NT + distention, soft Ext: 1+ edema NEURO:CNI Ox3 no asterixis   . folic acid  1 mg Oral Daily  . lactulose  20 g Oral TID  . midodrine  10 mg Oral TID WC  . multivitamin with minerals  1 tablet Oral Daily  . pantoprazole  40 mg Oral Daily  . prednisoLONE  40 mg Oral Daily  . rifaximin  550 mg Oral BID  . thiamine  100 mg Oral Daily   Or  . thiamine  100 mg Intravenous Daily   No results found. BMET    Component Value Date/Time   NA 134 (L) 06/21/2016 0428   K 4.6 06/21/2016 0428   CL 110 06/21/2016 0428   CO2 17 (L) 06/21/2016 0428   GLUCOSE 121 (H) 06/21/2016 0428   BUN 38 (H) 06/21/2016 0428   CREATININE 1.75 (H) 06/21/2016 0428   CALCIUM 8.3 (L) 06/21/2016 0428   GFRNONAA 46 (L) 06/21/2016 0428   GFRAA 53 (L) 06/21/2016 0428   CBC    Component Value Date/Time   WBC 13.1 (H) 06/21/2016 0428   RBC 2.45 (L) 06/21/2016 0428   HGB 8.2 (L) 06/21/2016 0428   HCT 23.9 (L) 06/21/2016 0428   PLT 88 (L) 06/21/2016 0428   MCV 97.6 06/21/2016 0428   MCH 33.5 06/21/2016 0428   MCHC 34.3 06/21/2016 0428   RDW 22.1 (H) 06/21/2016 0428   LYMPHSABS 1.3 01/08/2014 2029   MONOABS 0.8 01/08/2014 2029   EOSABS 0 01/08/2014 2029   BASOSABS 0 01/08/2014 2029     Assessment:  1. Acute Renal failure most likely from HRS.  UO remains incomplete per pt , Scr cont to trend down  His hyperbilirubinemia puts him at risk for ATN however 2. ETOH cirrhosis 3. Mild Hypokalemia, resolved 4.  Anemia  Plan: 1. No clear evidence on role of midodrine alone in HRS but I am not opposed to cont it as outpt and stopping it in few weeks if renal fx stable 2. Agree with Dr Jonnie Finner that HD is not an option and recent studies suggest HD is poor option if pt not a liver Tx candidate 3. At DC he should cont to FU with GI    Salli Bodin T

## 2016-06-21 NOTE — Progress Notes (Signed)
[facility] Progress Note  Covering for Dr. Benson Norway    Subjective: feels okay. No abdominal pain. No overt GI bleeding. Appetite is fine  Objective:  Vital signs in last 24 hours: Temp:  [98 F (36.7 C)-98.4 F (36.9 C)] 98 F (36.7 C) (05/13 3474) Pulse Rate:  [65-66] 65 (05/13 0614) Resp:  [18-20] 18 (05/13 0614) BP: (120-149)/(62-70) 128/62 (05/13 0614) SpO2:  [100 %] 100 % (05/13 0614) Last BM Date: 06/20/16 General:   Alert, obese white male in NAD EENT:  Normal hearing, icteric sclera Heart:  Regular rate and rhythm; no murmurs. BLE pitting edema Pulm: Normal respiratory effort, lungs CTA bilaterally without wheezes or crackles. Abdomen:  Soft, nondistended, nontender.  Normal bowel sounds, no masses felt. No hepatomegaly.    Neurologic:  Alert and  oriented x4;  grossly normal neurologically. Psych:  Alert and cooperative. Normal mood and affect.   Intake/Output from previous day: 05/12 0701 - 05/13 0700 In: 527.5 [P.O.:380; I.V.:147.5] Out: 250 [Urine:250] Intake/Output this shift: No intake/output data recorded.  Lab Results:  Recent Labs  06/19/16 0544 06/21/16 0428  WBC 16.0* 13.1*  HGB 8.6* 8.2*  HCT 25.3* 23.9*  PLT 125* 88*   BMET  Recent Labs  06/19/16 0544 06/20/16 0436 06/21/16 0428  NA 136 136 134*  K 4.1 4.6 4.6  CL 110 111 110  CO2 18* 17* 17*  GLUCOSE 111* 122* 121*  BUN 46* 43* 38*  CREATININE 2.20* 1.97* 1.75*  CALCIUM 8.3* 8.1* 8.3*   LFT  Recent Labs  06/19/16 0956  06/21/16 0428  PROT 5.6*  < > 5.1*  ALBUMIN 2.4*  < > 2.1*  AST 141*  < > 80*  ALT 192*  < > 137*  ALKPHOS 104  < > 91  BILITOT 25.3*  < > 20.0*  BILIDIR 15.2*  --   --   IBILI 10.1*  --   --   < > = values in this interval not displayed. PT/INR  Recent Labs  06/20/16 0436 06/21/16 0428  LABPROT 25.8* 24.8*  INR 2.31 2.20   Hepatitis Panel No results for input(s): HEPBSAG, HCVAB, HEPAIGM, HEPBIGM in the last 72 hours.  No  results found.  Assessment / Plan: 70. 46 yo male with decompensated ETOH / ETOH hepatitis / ARF, likely HRS. Black stools last week. EGD revealed hemorrhagic duodenopathy, no varices. No further black stools. LFTs slowly improving on prednisolone. Nephrology following. Renal function slowly improving. No longer on Octreotide, just Midodrine.  -Overall slowly improving. Dr. Benson Norway will round on patient tomorrow with discharge recommendations.   2. Normocytica anemia, hgb has stablized around 8.2 .  No overt bleeding.  3. long segment Barrett's on EGD, no biopsies done given acute illness.  -Continue daily PPI  Principal Problem:   GI bleed Active Problems:   Alcohol abuse   Decompensated hepatic cirrhosis (HCC)   Hepatorenal syndrome (HCC)   Esophageal varices (HCC)   Acute blood loss anemia   Coagulopathy (HCC)   Melena   AKI (acute kidney injury) ([place])   Alcoholic hepatitis without ascites   Portal hypertension ([place])     LOS: 9 days   Tye Savoy NP 06/21/2016, 1:56 PM Pager number [phone]  GI ATTENDING  Interval history data reviewed. Agree with interval progress note. I did discuss his case with nephrology, Dr. Mercy Moore. Patient continues with slow progress. Off octreotide. On prednisone. No new recommendations. Dr. Benson Norway to resume patient's GI care tomorrow. Thank you  Docia Chuck. Geri Seminole., M.D. [facility] Division of Gastroenterology

## 2016-06-22 LAB — COMPREHENSIVE METABOLIC PANEL
ALK PHOS: 97 U/L (ref 38–126)
ALT: 121 U/L — AB (ref 17–63)
AST: 76 U/L — ABNORMAL HIGH (ref 15–41)
Albumin: 2.2 g/dL — ABNORMAL LOW (ref 3.5–5.0)
Anion gap: 9 (ref 5–15)
BUN: 38 mg/dL — ABNORMAL HIGH (ref 6–20)
CALCIUM: 8.2 mg/dL — AB (ref 8.9–10.3)
CO2: 17 mmol/L — AB (ref 22–32)
CREATININE: 1.69 mg/dL — AB (ref 0.61–1.24)
Chloride: 108 mmol/L (ref 101–111)
GFR calc non Af Amer: 48 mL/min — ABNORMAL LOW (ref >60)
GFR, EST AFRICAN AMERICAN: 55 mL/min — AB (ref >60)
Glucose, Bld: 107 mg/dL — ABNORMAL HIGH (ref 65–99)
Potassium: 4.3 mmol/L (ref 3.5–5.1)
SODIUM: 134 mmol/L — AB (ref 135–145)
Total Bilirubin: 17.3 mg/dL — ABNORMAL HIGH (ref 0.3–1.2)
Total Protein: 5.2 g/dL — ABNORMAL LOW (ref 6.5–8.1)

## 2016-06-22 LAB — CBC
HEMATOCRIT: 23.3 % — AB (ref 39.0–52.0)
HEMOGLOBIN: 8 g/dL — AB (ref 13.0–17.0)
MCH: 33.8 pg (ref 26.0–34.0)
MCHC: 34.3 g/dL (ref 30.0–36.0)
MCV: 98.3 fL (ref 78.0–100.0)
Platelets: 84 10*3/uL — ABNORMAL LOW (ref 150–400)
RBC: 2.37 MIL/uL — AB (ref 4.22–5.81)
RDW: 21.8 % — ABNORMAL HIGH (ref 11.5–15.5)
WBC: 11.9 10*3/uL — ABNORMAL HIGH (ref 4.0–10.5)

## 2016-06-22 LAB — PROTIME-INR
INR: 2.24
PROTHROMBIN TIME: 25.1 s — AB (ref 11.4–15.2)

## 2016-06-22 MED ORDER — PREDNISOLONE 5 MG PO TABS: 40.0000 mg | ORAL_TABLET | Freq: Every day | ORAL | 0 refills | Status: DC

## 2016-06-22 MED ORDER — LACTULOSE 10 GM/15ML PO SOLN: 20.0000 g | Freq: Three times a day (TID) | ORAL | 0 refills | Status: DC

## 2016-06-22 MED ORDER — PREDNISOLONE 5 MG PO TABS: 40.0000 mg | ORAL_TABLET | Freq: Every day | ORAL | 1 refills | Status: AC

## 2016-06-22 MED ORDER — PREDNISOLONE 5 MG PO TABS: 40.0000 mg | ORAL_TABLET | Freq: Every day | ORAL | 1 refills | Status: DC

## 2016-06-22 MED ORDER — FUROSEMIDE 40 MG PO TABS: 40.0000 mg | ORAL_TABLET | Freq: Every day | ORAL | 2 refills | Status: AC

## 2016-06-22 MED ORDER — THIAMINE HCL 100 MG PO TABS: 100.0000 mg | ORAL_TABLET | Freq: Every day | ORAL | 1 refills | Status: DC

## 2016-06-22 MED ORDER — RIFAXIMIN 550 MG PO TABS: 550.0000 mg | ORAL_TABLET | Freq: Two times a day (BID) | ORAL | 1 refills | Status: DC

## 2016-06-22 MED ORDER — LACTULOSE 10 GM/15ML PO SOLN: 20.0000 g | Freq: Three times a day (TID) | ORAL | 0 refills | Status: AC

## 2016-06-22 MED ORDER — MIDODRINE HCL 10 MG PO TABS: 10.0000 mg | ORAL_TABLET | Freq: Three times a day (TID) | ORAL | 1 refills | Status: DC

## 2016-06-22 MED FILL — GENERLAC 10 GM/15 ML SOLN: 10 | 3 days supply | Qty: 240 | Fill #0

## 2016-06-22 MED FILL — FUROSEMIDE 40 MG TABLET: 40 | 30 days supply | Qty: 30 | Fill #0 | Status: TO

## 2016-06-22 MED FILL — MIDODRINE HCL 10 MG TABLET: 10 | 30 days supply | Qty: 90 | Fill #0

## 2016-06-22 NOTE — Progress Notes (Signed)
D[place]l Harbor to be D/C'd to home per MD order.  Discussed with the patient and all questions fully answered.  VSS, Skin clean, dry and intact without evidence of skin break down, no evidence of skin tears noted. IV catheter discontinued intact. Site without signs and symptoms of complications. Dressing and pressure applied.  An After Visit Summary was printed and given to the patient. Patient received prescriptions and match letter as well as note for work.  D/c education completed with patient/family including follow up instructions, medication list, d/c activities limitations if indicated, with other d/c instructions as indicated by MD - patient able to verbalize understanding, all questions fully answered.   Patient instructed to return to ED, call 911, or call MD for any changes in condition.   Patient escorted via [place], and D/C home via private auto.  Antino Mayabb L Price 06/22/2016 1:12 PM

## 2016-06-22 NOTE — Progress Notes (Signed)
Subjective: No complaints.  Objective: Vital signs in last 24 hours: Temp:  [98 F (36.7 C)-98.2 F (36.8 C)] 98 F (36.7 C) (05/14 0513) Pulse Rate:  [64-65] 65 (05/14 0513) Resp:  [20] 20 (05/14 0513) BP: (114-144)/(50-81) 114/50 (05/14 0513) SpO2:  [100 %] 100 % (05/14 0513) Weight:  [125.4 kg (276 lb 6.4 oz)] 125.4 kg (276 lb 6.4 oz) (05/14 0514) Last BM Date: 06/21/16  Intake/Output from previous day: 05/13 0701 - 05/14 0700 In: -  Out: 1400 [Urine:1400] Intake/Output this shift: No intake/output data recorded.  General appearance: alert and no distress GI: soft, non-tender; bowel sounds normal; no masses,  no organomegaly  Lab Results:  Recent Labs  06/21/16 0428 06/22/16 0417  WBC 13.1* 11.9*  HGB 8.2* 8.0*  HCT 23.9* 23.3*  PLT 88* 84*   BMET  Recent Labs  06/20/16 0436 06/21/16 0428 06/22/16 0417  NA 136 134* 134*  K 4.6 4.6 4.3  CL 111 110 108  CO2 17* 17* 17*  GLUCOSE 122* 121* 107*  BUN 43* 38* 38*  CREATININE 1.97* 1.75* 1.69*  CALCIUM 8.1* 8.3* 8.2*   LFT  Recent Labs  06/19/16 0956  06/22/16 0417  PROT 5.6*  < > 5.2*  ALBUMIN 2.4*  < > 2.2*  AST 141*  < > 76*  ALT 192*  < > 121*  ALKPHOS 104  < > 97  BILITOT 25.3*  < > 17.3*  BILIDIR 15.2*  --   --   IBILI 10.1*  --   --   < > = values in this interval not displayed. PT/INR  Recent Labs  06/21/16 0428 06/22/16 0417  LABPROT 24.8* 25.1*  INR 2.20 2.24   Hepatitis Panel No results for input(s): HEPBSAG, HCVAB, HEPAIGM, HEPBIGM in the last 72 hours. C-Diff No results for input(s): CDIFFTOX in the last 72 hours. Fecal Lactopherrin No results for input(s): FECLLACTOFRN in the last 72 hours.  Studies/Results: No results found.  Medications:  Scheduled: . folic acid  1 mg Oral Daily  . lactulose  20 g Oral TID  . midodrine  10 mg Oral TID WC  . multivitamin with minerals  1 tablet Oral Daily  . pantoprazole  40 mg Oral Daily  . prednisoLONE  40 mg Oral Daily  .  rifaximin  550 mg Oral BID  . thiamine  100 mg Oral Daily   Or  . thiamine  100 mg Intravenous Daily   Continuous: . sodium chloride      Assessment/Plan: 1) Decompensated cirrhosis - resolving. 2) HRS - resolved.   The patient is well and has made a remarkable recovery.  Laboratory values continue to trend in the right direction.  From the GI standpoint he can be discharged.    Plan: 1) Prednisolone 40 mg for another 3 weeks (total 4 weeks).  I will taper as an outpatient after the 4 weeks. 2) Follow up in the office in 1-2 two weeks.  I will make the arrangements. 3) Continue good nutrition. 4) Midodrine per Renal.  LOS: 10 days   Colin Hodges D 06/22/2016, 7:24 AM

## 2016-06-22 NOTE — Progress Notes (Signed)
  S:  No new CO- up in chair- dressed - hoping he will be able to go home  O:BP (!) 114/50 (BP Location: Left Arm) Comment: nurse was notified  Pulse 65   Temp 98 F (36.7 C)   Resp 20   Ht 5' 10"  (1.778 m)   Wt 125.4 kg (276 lb 6.4 oz)   SpO2 100%   BMI 39.66 kg/m   Intake/Output Summary (Last 24 hours) at 06/22/16 1118 Last data filed at 06/22/16 0900  Gross per 24 hour  Intake              222 ml  Output             1550 ml  Net            -1328 ml   Weight change:  CBJ:SEGBT and alert.  + jaundice and scleral icterus CVS:RRR Resp:Clear Abd:+ BS NT + distention, soft Ext: 1+ edema NEURO:CNI Ox3 no asterixis   . folic acid  1 mg Oral Daily  . lactulose  20 g Oral TID  . midodrine  10 mg Oral TID WC  . multivitamin with minerals  1 tablet Oral Daily  . pantoprazole  40 mg Oral Daily  . prednisoLONE  40 mg Oral Daily  . rifaximin  550 mg Oral BID  . thiamine  100 mg Oral Daily   Or  . thiamine  100 mg Intravenous Daily   No results found. BMET    Component Value Date/Time   NA 134 (L) 06/22/2016 0417   K 4.3 06/22/2016 0417   CL 108 06/22/2016 0417   CO2 17 (L) 06/22/2016 0417   GLUCOSE 107 (H) 06/22/2016 0417   BUN 38 (H) 06/22/2016 0417   CREATININE 1.69 (H) 06/22/2016 0417   CALCIUM 8.2 (L) 06/22/2016 0417   GFRNONAA 48 (L) 06/22/2016 0417   GFRAA 55 (L) 06/22/2016 0417   CBC    Component Value Date/Time   WBC 11.9 (H) 06/22/2016 0417   RBC 2.37 (L) 06/22/2016 0417   HGB 8.0 (L) 06/22/2016 0417   HCT 23.3 (L) 06/22/2016 0417   PLT 84 (L) 06/22/2016 0417   MCV 98.3 06/22/2016 0417   MCH 33.8 06/22/2016 0417   MCHC 34.3 06/22/2016 0417   RDW 21.8 (H) 06/22/2016 0417   LYMPHSABS 1.3 01/08/2014 2029   MONOABS 0.8 01/08/2014 2029   EOSABS 0 01/08/2014 2029   BASOSABS 0 01/08/2014 2029     Assessment:  1. Acute Renal failure most likely from HRS.  UO actually good at 1500- no diuretic , Scr cont to trend down  His hyperbilirubinemia puts him at  risk for ATN however 2. ETOH cirrhosis 3. Mild Hypokalemia, resolved 4. Anemia  Plan: 1. No clear evidence on role of midodrine alone in HRS but I am not opposed to cont it as outpt and stopping it in few weeks if renal fx stable 2. Agree with Dr Jonnie Finner that HD is not an option and recent studies suggest HD is poor option if pt not a liver Tx candidate 3. At DC he should cont to FU with GI.  I agree to resume some lasix at discharge, then add back aldactone per GI recs.  I do not see a role for nephrology follow up     [place]

## 2016-06-22 NOTE — Progress Notes (Signed)
CM learned match letter will not cover xifaxin prescription 2/2 to cost $ 2039.62. CM made Dr. Allyson Sabal and Dr. Benson Norway aware. Dr. Benson Norway to follow up with pt : Appointment is on 06/24/16 at [facility], [place] [address]  [phone]   Whitman Hero RN, [facility]

## 2016-06-22 NOTE — Discharge Summary (Signed)
Physician Discharge Summary  Colin Hodges MRN: [id number] DOB/AGE: 1970-10-19 46 y.o.  PCP: Patient, No Pcp Per   Admit date: 06/12/2016 Discharge date: 06/22/2016  Discharge Diagnoses:    Principal Problem:   GI bleed Active Problems:   Alcohol abuse   Decompensated hepatic cirrhosis (HCC)   Hepatorenal syndrome (HCC)   Esophageal varices (HCC)   Acute blood loss anemia   Coagulopathy (HCC)   Melena   AKI (acute kidney injury) ([place])   Alcoholic hepatitis without ascites   Portal hypertension ([place])    Follow-up recommendations Follow-up with PCP in 3-5 days , including all  additional recommended appointments as below Follow-up CBC, CMP in 3-5 days Follow with gastroenterology as recommended      Current Discharge Medication List    START taking these medications   Details  lactulose (CHRONULAC) 10 GM/15ML solution Take 30 mLs (20 g total) by mouth 3 (three) times daily. Qty: 240 mL, Refills: 0    midodrine (PROAMATINE) 10 MG tablet Take 1 tablet (10 mg total) by mouth 3 (three) times daily with meals. Qty: 90 tablet, Refills: 1    prednisoLONE 5 MG TABS tablet Take 8 tablets (40 mg total) by mouth daily. Qty: 180 each, Refills: 1    rifaximin (XIFAXAN) 550 MG TABS tablet Take 1 tablet (550 mg total) by mouth 2 (two) times daily. Qty: 60 tablet, Refills: 1    thiamine 100 MG tablet Take 1 tablet (100 mg total) by mouth daily. Qty: 30 tablet, Refills: 1      CONTINUE these medications which have CHANGED   Details  furosemide (LASIX) 40 MG tablet Take 1 tablet (40 mg total) by mouth daily. Qty: 30 tablet, Refills: 2      CONTINUE these medications which have NOT CHANGED   Details  Cholecalciferol (VITAMIN D3) 5000 UNITS TABS Take 1 tablet by mouth 3 (three) times a week.    folic acid (FOLVITE) 1 MG tablet Take 1 tablet (1 mg total) by mouth daily. Qty: 30 tablet, Refills: 0    Glucosamine-Chondroit-Vit C-Mn (GLUCOSAMINE 1500 COMPLEX PO) Take 1 tablet  by mouth daily.    LORazepam (ATIVAN) 0.5 MG tablet Take 1 tablet (0.5 mg total) by mouth daily as needed for anxiety. Qty: 10 tablet, Refills: 0    ondansetron (ZOFRAN) 4 MG tablet Take 1 tablet (4 mg total) by mouth every 6 (six) hours as needed for nausea. Qty: 20 tablet, Refills: 0    pantoprazole (PROTONIX) 40 MG tablet Take 1 tablet (40 mg total) by mouth 2 (two) times daily. Qty: 60 tablet, Refills: 1    phytonadione (VITAMIN K) 5 MG tablet Take 1 tablet (5 mg total) by mouth daily. Qty: 3 tablet, Refills: 0      STOP taking these medications     predniSONE (DELTASONE) 50 MG tablet      spironolactone (ALDACTONE) 100 MG tablet          Discharge Condition:Stable    Discharge Instructions Get Medicines reviewed and adjusted: Please take all your medications with you for your next visit with your Primary MD  Please request your Primary MD to go over all hospital tests and procedure/radiological results at the follow up, please ask your Primary MD to get all Hospital records sent to his/her office.  If you experience worsening of your admission symptoms, develop shortness of breath, life threatening emergency, suicidal or homicidal thoughts you must seek medical attention immediately by calling 911 or calling your MD immediately if  symptoms less severe.  You must read complete instructions/literature along with all the possible adverse reactions/side effects for all the Medicines you take and that have been prescribed to you. Take any new Medicines after you have completely understood and accpet all the possible adverse reactions/side effects.   Do not drive when taking Pain medications.   Do not take more than prescribed Pain, Sleep and Anxiety Medications  Special Instructions: If you have smoked or chewed Tobacco in the last 2 yrs please stop smoking, stop any regular Alcohol and or any Recreational drug use.  Wear Seat belts while driving.  Please note  You  were cared for by a hospitalist during your hospital stay. Once you are discharged, your primary care physician will handle any further medical issues. Please note that NO REFILLS for any discharge medications will be authorized once you are discharged, as it is imperative that you return to your primary care physician (or establish a relationship with a primary care physician if you do not have one) for your aftercare needs so that they can reassess your need for medications and monitor your lab values.  Discharge Instructions    Diet - low sodium heart healthy    Complete by:  As directed    Increase activity slowly    Complete by:  As directed        No Known Allergies    Disposition: 01-Home or Self Care   Consults:  GI Nephrology    Significant Diagnostic Studies:  US Abdomen Complete  Result Date: 06/02/2016 CLINICAL DATA:  Jaundice EXAM: ABDOMEN ULTRASOUND COMPLETE COMPARISON:  01/09/2014 FINDINGS: Gallbladder: Gallbladder is well distended with gallbladder wall thickening to 7.4 mm. Multiple calculi are noted. A negative sonographic Murphy's sign is noted. Common bile duct: Diameter: 5.4 mm. Liver: Diffusely increased in echogenicity consistent with fatty infiltration. This is similar to that seen on the prior CT examination. The liver is also enlarged in size. Phasic flow is noted within the portal vein with a to and fro component. IVC: No abnormality visualized. Pancreas: Visualized portion unremarkable. Spleen: Splenomegaly is noted. Splenic volume is approximately 1,500 cubic cm. Perisplenic varices are again noted. Right Kidney: Length: 14.3 cm. Echogenicity within normal limits. No mass or hydronephrosis visualized. Left Kidney: Length: 14.9 cm. Echogenicity within normal limits. No mass or hydronephrosis visualized. Abdominal aorta: No aneurysm visualized. Other findings: Mild ascites is noted. IMPRESSION: Gallbladder is well distended with evidence of cholelithiasis and  gallbladder wall thickening. The wall thickening may be in part due to the underlying ascites. No sonographic Percell Miller sign is noted. Hepatosplenomegaly with diffuse fatty infiltration of the liver similar to that seen on prior CT examination. Perisplenic varices consistent with a degree of portal hypertension. Electronically Signed   By: Inez Catalina M.D.   On: 06/02/2016 10:05   US Renal  Result Date: 06/13/2016 CLINICAL DATA:  Acute kidney injury EXAM: RENAL / URINARY TRACT ULTRASOUND COMPLETE COMPARISON:  None. FINDINGS: Right Kidney: Length: 15.5 cm. Echogenicity within normal limits. No mass or hydronephrosis visualized. Left Kidney: Length: 15.5 cm. Echogenicity within normal limits. No mass or hydronephrosis visualized. Bladder: Appears normal for degree of bladder distention. Peritoneal ascites is visible in the perihepatic space. IMPRESSION: Normal kidneys and bladder.  Peritoneal ascites noted. Electronically Signed   By: Andreas Newport M.D.   On: 06/13/2016 06:20        Filed Weights   06/19/16 1541 06/20/16 0541 06/22/16 0514  Weight: 127 kg (280 lb) 128.3 kg (282  lb 14.4 oz) 125.4 kg (276 lb 6.4 oz)     Microbiology: Recent Results (from the past 240 hour(s))  MRSA PCR Screening     Status: None   Collection Time: 06/12/16  5:12 PM  Result Value Ref Range Status   MRSA by PCR NEGATIVE NEGATIVE Final    Comment:        The GeneXpert MRSA Assay (FDA approved for NASAL specimens only), is one component of a comprehensive MRSA colonization surveillance program. It is not intended to diagnose MRSA infection nor to guide or monitor treatment for MRSA infections.        Blood Culture    Component Value Date/Time   SDES URINE, CLEAN CATCH 01/09/2014 1652   SPECREQUEST NONE 01/09/2014 1652   CULT NO GROWTH Performed at [facility]  01/09/2014 1652   REPTSTATUS 01/10/2014 FINAL 01/09/2014 1652      Labs: Results for orders placed or performed during the  hospital encounter of 06/12/16 (from the past 48 hour(s))  Protime-INR     Status: Abnormal   Collection Time: 06/21/16  4:28 AM  Result Value Ref Range   Prothrombin Time 24.8 (H) 11.4 - 15.2 seconds   INR 2.20   Comprehensive metabolic panel     Status: Abnormal   Collection Time: 06/21/16  4:28 AM  Result Value Ref Range   Sodium 134 (L) 135 - 145 mmol/L   Potassium 4.6 3.5 - 5.1 mmol/L   Chloride 110 101 - 111 mmol/L   CO2 17 (L) 22 - 32 mmol/L   Glucose, Bld 121 (H) 65 - 99 mg/dL   BUN 38 (H) 6 - 20 mg/dL   Creatinine, Ser 1.75 (H) 0.61 - 1.24 mg/dL   Calcium 8.3 (L) 8.9 - 10.3 mg/dL   Total Protein 5.1 (L) 6.5 - 8.1 g/dL   Albumin 2.1 (L) 3.5 - 5.0 g/dL   AST 80 (H) 15 - 41 U/L   ALT 137 (H) 17 - 63 U/L   Alkaline Phosphatase 91 38 - 126 U/L   Total Bilirubin 20.0 (HH) 0.3 - 1.2 mg/dL    Comment: CRITICAL VALUE NOTED.  VALUE IS CONSISTENT WITH PREVIOUSLY REPORTED AND CALLED VALUE.   GFR calc non Af Amer 46 (L) >60 mL/min   GFR calc Af Amer 53 (L) >60 mL/min    Comment: (NOTE) The eGFR has been calculated using the CKD EPI equation. This calculation has not been validated in all clinical situations. eGFR's persistently <60 mL/min signify possible Chronic Kidney Disease.    Anion gap 7 5 - 15  CBC     Status: Abnormal   Collection Time: 06/21/16  4:28 AM  Result Value Ref Range   WBC 13.1 (H) 4.0 - 10.5 K/uL   RBC 2.45 (L) 4.22 - 5.81 MIL/uL   Hemoglobin 8.2 (L) 13.0 - 17.0 g/dL   HCT 23.9 (L) 39.0 - 52.0 %   MCV 97.6 78.0 - 100.0 fL   MCH 33.5 26.0 - 34.0 pg   MCHC 34.3 30.0 - 36.0 g/dL   RDW 22.1 (H) 11.5 - 15.5 %   Platelets 88 (L) 150 - 400 K/uL    Comment: CONSISTENT WITH PREVIOUS RESULT  Ammonia     Status: Abnormal   Collection Time: 06/21/16  4:28 AM  Result Value Ref Range   Ammonia 74 (H) 9 - 35 umol/L  Protime-INR     Status: Abnormal   Collection Time: 06/22/16  4:17 AM  Result Value Ref Range  Prothrombin Time 25.1 (H) 11.4 - 15.2 seconds   INR  2.24   CBC     Status: Abnormal   Collection Time: 06/22/16  4:17 AM  Result Value Ref Range   WBC 11.9 (H) 4.0 - 10.5 K/uL   RBC 2.37 (L) 4.22 - 5.81 MIL/uL   Hemoglobin 8.0 (L) 13.0 - 17.0 g/dL   HCT 23.3 (L) 39.0 - 52.0 %   MCV 98.3 78.0 - 100.0 fL   MCH 33.8 26.0 - 34.0 pg   MCHC 34.3 30.0 - 36.0 g/dL   RDW 21.8 (H) 11.5 - 15.5 %   Platelets 84 (L) 150 - 400 K/uL    Comment: CONSISTENT WITH PREVIOUS RESULT  Comprehensive metabolic panel     Status: Abnormal   Collection Time: 06/22/16  4:17 AM  Result Value Ref Range   Sodium 134 (L) 135 - 145 mmol/L   Potassium 4.3 3.5 - 5.1 mmol/L   Chloride 108 101 - 111 mmol/L   CO2 17 (L) 22 - 32 mmol/L   Glucose, Bld 107 (H) 65 - 99 mg/dL   BUN 38 (H) 6 - 20 mg/dL   Creatinine, Ser 1.69 (H) 0.61 - 1.24 mg/dL   Calcium 8.2 (L) 8.9 - 10.3 mg/dL   Total Protein 5.2 (L) 6.5 - 8.1 g/dL   Albumin 2.2 (L) 3.5 - 5.0 g/dL   AST 76 (H) 15 - 41 U/L   ALT 121 (H) 17 - 63 U/L   Alkaline Phosphatase 97 38 - 126 U/L   Total Bilirubin 17.3 (H) 0.3 - 1.2 mg/dL   GFR calc non Af Amer 48 (L) >60 mL/min   GFR calc Af Amer 55 (L) >60 mL/min    Comment: (NOTE) The eGFR has been calculated using the CKD EPI equation. This calculation has not been validated in all clinical situations. eGFR's persistently <60 mL/min signify possible Chronic Kidney Disease.    Anion gap 9 5 - 15     Lipid Panel  No results found for: CHOL, TRIG, HDL, CHOLHDL, VLDL, LDLCALC, LDLDIRECT   No results found for: HGBA1C   Lab Results  Component Value Date   CREATININE 1.69 (H) 06/22/2016     HPI :*  46 year old male with decompensated alcoholic liver cirrhosis with esophageal varices and gastritis who was admitted for melena. He reported feeling weak and had some epigastric discomfort. His girlfriend at bedside reports him to be getting confused for last few days. Girlfriend Reported that he has not been drinking since he got hospitalized in 2015. He called Dr.  Benson Norway and was hostile, to the hospital. 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 His MELD score was 27 at that time and could not get a TIPS.  On admission he had significant leukocytosis (37K) with a hemoglobin of 7.2 which dropped to 5.5 this morning. Chemistry showed sodium of 129, K of 5.2, chloride 97, CO2 of 11, BUN of 133 and creatinine of 4.77 and INR of 2.84. He had an anion gap of 21. Patient placed on octreotide drip, IV Protonix. aHe also received 2 units FFP on admission along with vitamin K.   HOSPITAL COURSE:   Decompensated hepatic cirrhosis (HCC)/shock liver/possible EtOH hepatitis With associated upper GI bleed and coagulopathy.. EGD 06/13/16 showed  nonbleeding portal hypertensive gastropathy, hemorrhagic duodenopathy without active bleeding. -Off octreotide drip for HRS, see management as below . Continue PPI   .  Status post 6 units since admission . Received 2 unit FFP, vitamin K daily 3 days, hemoglobin 8.2>7.6>8.2>8.0 -empiric Rocephin for SBP prophylaxis discontinued after 7 days of treatment. MELD score >40. Significant transaminitis possibly due to acute hepatitis/shock liver. LFTs slowly improving but bilirubin remains high. Not a candidate for TIPSS or liver transplant ( last drink was about 6 months ago). Also not a candidate for dialysis. encephalopathy improved. INR 2.37>2.18 AST, ALT  stable for 2 days, bilirubin continues to trend  17.3 today Continue prednisolone, duration to be specified by GI, see GI as scheduled     Acute upper GI bleed Hemoglobin stable, no need for repeat EGD as per Dr. Benson Norway  Acute Alcoholic hepatitis Continue oral prednisolone/rifaximin. LFTs slowly improving.  Acute kidney injury with? Hepatorenal syndrome ([place]) Reportedly had normal renal function a few months back. ATN  and metabolic acidosis on  presentation. Renal ultrasound without medical renal disease or obstruction. Renal following. HRS protocol initiated. Received IV albumin. Continue midodrine. Bicarbonate , octreotide Renal function improving. Patient is not a dialysis candidate. stopped octreotide as Scr <2 as per nephrology recommendations, will need 1-2d to make sure renal fx is stable post DC of med Patient continues to be at high risk for ATN secondary to hyperbilirubinemia  follow renal function , nephrology prior to discharge Resume diuretics as recommended by GI Patient's Lasix and Aldactone have been on hold  Hyponatremia Secondary to decompensated liver disease versus HRS. resolved. Sodium 134 prior to discharge  Hepatic/uremic encephalopathy. Encephalopathy resolved.. Continue rifaximin and lactulose. Last ammonia level was 74  Alcohol abuse Reports last drink to be about 6 months back. Stable on CIWA, no signs of withdrawal   Hyperkalemia, now hypokalemic Repleted, magnesium 2.3  Leukocytosis Probably secondary to steroids, doubt SBP. Completed empiric Rocephin.  Improving  Discharge Exam:   Blood pressure (!) 114/50, pulse 65, temperature 98 F (36.7 C), resp. rate 20, height 5' 10"  (1.778 m), weight 125.4 kg (276 lb 6.4 oz), SpO2 100 %.    Gen.: Severely jaundiced, not in distress HEENT: Icteric, pallor present, moist mucosa Chest: Clear to auscultation bilaterally  CVS: Normal S1 and S2, no murmurs GI: Soft, mild abdominal distention, nontender Musculoskeletal: Trace pitting edema bilaterally, CNS: Alert and oriented, no tremors  Follow-up Information    Carol Ada, MD. Call.   Specialty:  Gastroenterology Why:  Hospital follow-up in 3-5 days Contact information: [address] [place] [address] [phone]        Primary care provider. Call.   Why:  Hospital follow-up in 3-5 days          Signed: Reyne Dumas 06/22/2016, 11:05 AM         Time spent >45 mins

## 2016-06-23 NOTE — Progress Notes (Signed)
Colin Hodges, d/c yesterday and need a RX for protonix, his xifaxin was not covered with match letter and is $2,000+, he want to know alternative. Made doctor Abrol aware and patient was instructed to continue lactulose and follow up with Dr. Benson Norway.

## 2016-06-26 ENCOUNTER — Encounter: Payer: Self-pay | Admitting: Physician Assistant

## 2016-06-26 ENCOUNTER — Ambulatory Visit: Payer: Self-pay | Attending: Internal Medicine | Admitting: Physician Assistant

## 2016-06-26 VITALS — BP 142/74 | HR 72 | Temp 98.5°F | Resp 16 | Wt 264.8 lb

## 2016-06-26 DIAGNOSIS — I8501 Esophageal varices with bleeding: Secondary | ICD-10-CM

## 2016-06-26 DIAGNOSIS — K767 Hepatorenal syndrome: Secondary | ICD-10-CM | POA: Insufficient documentation

## 2016-06-26 DIAGNOSIS — K7011 Alcoholic hepatitis with ascites: Secondary | ICD-10-CM | POA: Insufficient documentation

## 2016-06-26 DIAGNOSIS — E876 Hypokalemia: Secondary | ICD-10-CM | POA: Insufficient documentation

## 2016-06-26 DIAGNOSIS — I8511 Secondary esophageal varices with bleeding: Secondary | ICD-10-CM | POA: Insufficient documentation

## 2016-06-26 DIAGNOSIS — Z79899 Other long term (current) drug therapy: Secondary | ICD-10-CM | POA: Insufficient documentation

## 2016-06-26 DIAGNOSIS — D62 Acute posthemorrhagic anemia: Secondary | ICD-10-CM | POA: Insufficient documentation

## 2016-06-26 MED FILL — PANTOPRAZOLE SOD DR 40 MG T: 40 | 30 days supply | Qty: 60 | Fill #0

## 2016-06-26 NOTE — Patient Instructions (Signed)
Carol Ada, MD. Call.   Specialty:  Gastroenterology Why:  Hospital follow-up in 3-5 days Contact information: [address] [place] [address] [phone]

## 2016-06-26 NOTE — Progress Notes (Signed)
Patient ID: Colin Hodges, male   DOB: November 27, 1970, 46 y.o.   MRN: [id number]   Colin Hodges, is a 47 y.o. male  DGU:[id number]  ZDG:[id number]  DOB - [birth year]  Subjective:  Chief Complaint and HPI: Colin Hodges is a 46 y.o. male here today to establish care and for a follow up visitafter being in the hospital 06/12/2016-06/22/2016 for GI bleed from esophageal varices.  Today he presents feeling much better overall.  He saw Dr Benson Norway yesterday and had labs done there.  He has already been notified that things were improving with his labs.  He is already back to work.  Regarding alcohol use-he tells me that he drank some, occasionally, but not much up then stopped drinking at all 6 months ago. No AA.   He said he felt in his normal state of health until a few days leading to this recent hospitalization.  Per patient, Dr. Benson Norway is hoping he will be a candidate for liver transplant list once he has been sober for a year.    He is having a lot of financial issues and met with our financial counselor, Diane, today.    From hospital discharge summary:  HOSPITAL COURSE:   Decompensated hepatic cirrhosis (HCC)/shock liver/possible EtOH hepatitis With associated upper GI bleed and coagulopathy.. EGD 06/13/16 showed  nonbleeding portal hypertensive gastropathy, hemorrhagic duodenopathy without active bleeding. -Offoctreotide drip for HRS, see management as below . Continue PPI   . Status post 6 units since admission . Received 2 unit FFP, vitamin K daily 3 days, hemoglobin 8.2>7.6>8.2>8.0 -empiric Rocephin for SBP prophylaxis discontinued after 7 days of treatment. MELD score >40. Significant transaminitis possibly due to acute hepatitis/shock liver. LFTs slowly improving but bilirubin remains high. Not a candidate for TIPSS or liver transplant ( last drink was about 6 months ago). Also not a candidate for dialysis. encephalopathy improved. INR 2.37>2.18 AST, ALT stable for 2 days, bilirubin continues to  trend  17.3 today Continue prednisolone, duration to be specified by GI, see GI as scheduled   Acute upper GI bleed Hemoglobin stable, no need for repeat EGD as per Dr. Benson Norway  Acute Alcoholic hepatitis Continue oral prednisolone/rifaximin. LFTs slowly improving.  Acute kidney injury with? Hepatorenal syndrome ([place]) Reportedly had normal renal function a few months back. ATN and metabolic acidosis on presentation. Renal ultrasound without medical renal disease or obstruction. Renal following. HRS protocol initiated. Received IV albumin. Continue midodrine. Bicarbonate , octreotide Renal function improving. Patient is not a dialysis candidate. stopped octreotide as Scr <2 as per nephrology recommendations, will need 1-2d to make sure renal fx is stable post DC of med Patient continues to be at high risk for ATN secondary to hyperbilirubinemia  follow renal function , nephrology prior to discharge Resume diuretics as recommended by GI Patient's Lasix and Aldactone have been on hold  Hyponatremia Secondary to decompensated liver disease versus HRS. resolved. Sodium 134 prior to discharge  Hepatic/uremic encephalopathy. Encephalopathy resolved.. Continue rifaximin and lactulose. Last ammonia level was 74  Alcohol abuse Reports last drink to be about 6 months back. Stable on CIWA, no signs of withdrawal  Hyperkalemia, now hypokalemic Repleted, magnesium 2.3  Leukocytosis Probably secondary to steroids, doubt SBP. Completed empiricRocephin.  Improving  Carol Ada, MD. Call.   Specialty:  Gastroenterology Why:  Hospital follow-up in 3-5 days Contact information: [address] [place] [address] [phone]  .   ED/Hospital notes reviewed.   Social History: non-smoker, sober X 6 months by history, has a  gf, lays hardwood flooring.    ROS:   Constitutional:  No f/c, No night sweats, No unexplained weight loss. EENT:  No vision changes, No blurry  vision, No hearing changes. No mouth, throat, or ear problems.  Respiratory: No cough, No SOB Cardiac: No CP, no palpitations GI:  No abd pain, No N/V/D. GU: No Urinary s/sx Musculoskeletal: No joint pain Neuro: No headache, no dizziness, no motor weakness.  Skin: No rash Endocrine:  No polydipsia. No polyuria.  Psych: Denies SI/HI  No problems updated.  ALLERGIES: No Known Allergies  PAST MEDICAL HISTORY: Past Medical History:  Diagnosis Date  . Alcoholic hepatitis 8937, 34/2876   Discriminant function score 53 06/13/16: started 28 day course Prednisolone.    . Anemia 06/2016   in setting of GI bleed with melena  . Ascites 2015  . Cirrhosis, alcoholic ([place]) 8115  . Coagulopathy ([place]) 2015 and 06/2016.  Marland Kitchen Hypertension     MEDICATIONS AT HOME: Prior to Admission medications   Medication Sig Start Date End Date Taking? Authorizing Provider  Cholecalciferol (VITAMIN D3) 5000 UNITS TABS Take 1 tablet by mouth 3 (three) times a week.    [provider]  folic acid (FOLVITE) 1 MG tablet Take 1 tablet (1 mg total) by mouth daily. 01/14/14   Oswald Hillock, MD  furosemide (LASIX) 40 MG tablet Take 1 tablet (40 mg total) by mouth daily. 06/29/16   Reyne Dumas, MD  Glucosamine-Chondroit-Vit C-Mn (GLUCOSAMINE 1500 COMPLEX PO) Take 1 tablet by mouth daily.    [provider]  lactulose (CHRONULAC) 10 GM/15ML solution Take 30 mLs (20 g total) by mouth 3 (three) times daily. 06/22/16   Reyne Dumas, MD  LORazepam (ATIVAN) 0.5 MG tablet Take 1 tablet (0.5 mg total) by mouth daily as needed for anxiety. Patient not taking: Reported on 06/13/2016 01/14/14   Oswald Hillock, MD  midodrine (PROAMATINE) 10 MG tablet Take 1 tablet (10 mg total) by mouth 3 (three) times daily with meals. 06/22/16   Reyne Dumas, MD  ondansetron (ZOFRAN) 4 MG tablet Take 1 tablet (4 mg total) by mouth every 6 (six) hours as needed for nausea. Patient not taking: Reported on 06/13/2016 01/14/14   Oswald Hillock, MD  pantoprazole (PROTONIX) 40 MG tablet Take 1 tablet (40 mg total) by mouth 2 (two) times daily. Patient not taking: Reported on 06/13/2016 01/14/14   Oswald Hillock, MD  phytonadione (VITAMIN K) 5 MG tablet Take 1 tablet (5 mg total) by mouth daily. Patient not taking: Reported on 06/13/2016 01/14/14   Oswald Hillock, MD  prednisoLONE 5 MG TABS tablet Take 8 tablets (40 mg total) by mouth daily. 06/23/16 07/23/16  Reyne Dumas, MD  rifaximin (XIFAXAN) 550 MG TABS tablet Take 1 tablet (550 mg total) by mouth 2 (two) times daily. 06/22/16   Reyne Dumas, MD  thiamine 100 MG tablet Take 1 tablet (100 mg total) by mouth daily. 06/23/16   Reyne Dumas, MD     Objective:  EXAM:   Vitals:   06/26/16 1019  BP: (!) 142/74  Pulse: 72  Resp: 16  Temp: 98.5 F (36.9 C)  TempSrc: Oral  SpO2: 97%  Weight: 264 lb 12.8 oz (120.1 kg)    General appearance : A&OX3. NAD. Non-toxic-appearing.  He is jaundiced.   HEENT: Atraumatic and Normocephalic.  PERRLA. EOM intact.  TM clear B. Neck: supple, no JVD. No cervical lymphadenopathy. No thyromegaly Chest/Lungs:  Breathing-non-labored, Good air entry bilaterally, breath sounds  normal without rales, rhonchi, or wheezing  CVS: S1 S2 regular, no murmurs, gallops, rubs  Extremities: Bilateral Lower Ext shows 1+ pretibial edema, both legs are warm to touch with = pulse throughout Neurology:  CN II-XII grossly intact, Non focal.   Psych:  TP linear. J/I WNL. Normal speech. Appropriate eye contact and affect.  Skin:  No Rash  Data Review No results found for: HGBA1C   Assessment & Plan   1. Bleeding esophageal varices, unspecified esophageal varices type ([place]) Continue f/up with Dr.  Benson Norway and continue current regimen.  Continued sobriety imperative-I recommended 12 step recovery meetings as a form of continued help and support in his sobriety.    2. Alcoholic hepatitis with ascites Continue f/up with Dr.  Benson Norway and continue current regimen.  Continued  sobriety imperative-I recommended 12 step recovery meetings as a form of continued help and support in his sobriety.    3. Hepatorenal syndrome ([place]) Labs done and improving as of yesterday per patient(not in Epic).  I have advised him to get a copy of these and bring them to his next visit.   4. Acute blood loss anemia Labs done and improving as of yesterday per patient(not in Epic).  I have advised him to get a copy of these and bring them to his next visit.   5. Hypokalemia Labs done and improving as of yesterday per patient(not in Epic).  I have advised him to get a copy of these and bring them to his next visit.   He is going to be working with our Development worker, community and the pharmacy to see where we may provide assistance.    Spent >35 mins face to face counseling on healthy lifestyle/sobriety/12 step recoveryPatient have been counseled extensively about nutrition and exercise in addition to time spent reviewing his hospital course and records.    Return in about 2 weeks (around 07/10/2016) for assign PCP; f/up GI bleed/coagulopathy/liver failure.  The patient was given clear instructions to go to ER or return to medical center if symptoms don't improve, worsen or new problems develop. The patient verbalized understanding. The patient was told to call to get lab results if they haven't heard anything in the next week.     Freeman Caldron, PA-C [facility] and [place], [place]   06/26/2016, 12:53 PM

## 2016-07-03 ENCOUNTER — Ambulatory Visit: Payer: Self-pay | Attending: Internal Medicine

## 2016-07-08 MED FILL — SPIRONOLACTONE 100 MG TAB: 100 | 30 days supply | Qty: 30 | Fill #0

## 2016-07-08 MED FILL — LACTULOSE 10 GM/15 ML SOLN: 10 | 30 days supply | Qty: 2700 | Fill #0

## 2016-07-10 ENCOUNTER — Encounter: Payer: Self-pay | Admitting: Internal Medicine

## 2016-07-10 ENCOUNTER — Ambulatory Visit: Payer: Self-pay | Attending: Internal Medicine | Admitting: Internal Medicine

## 2016-07-10 VITALS — BP 157/87 | HR 94 | Temp 98.5°F | Resp 16 | Wt 276.2 lb

## 2016-07-10 DIAGNOSIS — K7011 Alcoholic hepatitis with ascites: Secondary | ICD-10-CM

## 2016-07-10 DIAGNOSIS — R030 Elevated blood-pressure reading, without diagnosis of hypertension: Secondary | ICD-10-CM

## 2016-07-10 DIAGNOSIS — K767 Hepatorenal syndrome: Secondary | ICD-10-CM

## 2016-07-10 DIAGNOSIS — Z9889 Other specified postprocedural states: Secondary | ICD-10-CM | POA: Insufficient documentation

## 2016-07-10 DIAGNOSIS — Z131 Encounter for screening for diabetes mellitus: Secondary | ICD-10-CM

## 2016-07-10 DIAGNOSIS — Z79899 Other long term (current) drug therapy: Secondary | ICD-10-CM | POA: Insufficient documentation

## 2016-07-10 LAB — POCT GLYCOSYLATED HEMOGLOBIN (HGB A1C): Hemoglobin A1C: 5

## 2016-07-10 NOTE — Patient Instructions (Signed)
Decrease Midodrine to twice a day.  Try to check your blood pressure twice a week.  If your 130/80 or a little lower , that is ok.    Speak with Dr. Benson Norway about need to continue this medication on your next visit with him.  I have referred you for follow up with the kidney specialist.

## 2016-07-10 NOTE — Progress Notes (Signed)
Patient ID: Colin Hodges, male    DOB: [birth year]  MRN: [id number]  CC: Establish Care   Subjective: Colin Hodges is a 46 y.o. male who presents for f/u to become est with me as PCP.  Saw our PA post hospital follow-up for GI bleed from esophageal varices about 2 weeks ago. His concerns today include:  46 year old with history of alcoholic cirrhosis with associated portal hypertension, hepatorenal syndrome and coagulopathy.  He is followed by gastroenterologist Dr. Benson Norway home he saw her about 2 weeks ago.  He brings a copy of recent blood tests done at Dr. Lloyd Huger office that includes a CBC, complete metabolic panel and INR of 1.6   Alcoholic cirrhosis:  Reports no bleeding but bruises a little on forearms -no blood in stools -taking Lactulose as prescribed with 3-4 loose stools a day. -also compliant with Protonix, Midodrine -on Prednisone, he is not sure why but plan is to taper off over several wks -labs reveal Hb stable at 8.5 and low platelet count.  INR 1.6, ALT 109 down from 137 but mild elevation in AST. Bilirubin trending down -Swelling in the lower extremities that is better in the mornings and worsens during the day. Uses Lasix as needed   ETOH - maintained sobriety since last visit. He has not looked into the 12-step program with AA.  He feels he doesn't need it. -States he is feeling much better and can tell the difference in his energy level   AKI: Has not seen kidney specialist since hosp dischg   Patient Active Problem List   Diagnosis Date Noted  . Diabetes mellitus screening 07/10/2016  . Alcoholic hepatitis without ascites   . Portal hypertension ([place])   . AKI (acute kidney injury) ([place])   . Melena   . Cirrhosis ([place]) 06/12/2016  . Decompensated hepatic cirrhosis ([place]) 06/12/2016  . Hepatorenal syndrome ([place]) 06/12/2016  . Esophageal varices ([place]) 06/12/2016  . Acute blood loss anemia 06/12/2016  . Coagulopathy ([place]) 06/12/2016  . Abnormal LFTs   .  Alcoholic hepatitis with ascites   . Ascites 01/08/2014  . GI bleed 01/08/2014  . Alcohol abuse 01/08/2014  . Elevated bilirubin 01/08/2014  . Nausea & vomiting 01/08/2014  . UTI (urinary tract infection) 01/08/2014     Current Outpatient Prescriptions on File Prior to Visit  Medication Sig Dispense Refill  . Cholecalciferol (VITAMIN D3) 5000 UNITS TABS Take 1 tablet by mouth 3 (three) times a week.    . folic acid (FOLVITE) 1 MG tablet Take 1 tablet (1 mg total) by mouth daily. 30 tablet 0  . furosemide (LASIX) 40 MG tablet Take 1 tablet (40 mg total) by mouth daily. 30 tablet 2  . lactulose (CHRONULAC) 10 GM/15ML solution Take 30 mLs (20 g total) by mouth 3 (three) times daily. 240 mL 0  . midodrine (PROAMATINE) 10 MG tablet Take 1 tablet (10 mg total) by mouth 3 (three) times daily with meals. 90 tablet 1  . ondansetron (ZOFRAN) 4 MG tablet Take 1 tablet (4 mg total) by mouth every 6 (six) hours as needed for nausea. (Patient not taking: Reported on 06/13/2016) 20 tablet 0  . pantoprazole (PROTONIX) 40 MG tablet Take 1 tablet (40 mg total) by mouth 2 (two) times daily. (Patient not taking: Reported on 06/13/2016) 60 tablet 1  . prednisoLONE 5 MG TABS tablet Take 8 tablets (40 mg total) by mouth daily. 180 each 1  . rifaximin (XIFAXAN) 550 MG TABS tablet Take 1 tablet (550  mg total) by mouth 2 (two) times daily. 60 tablet 1  . thiamine 100 MG tablet Take 1 tablet (100 mg total) by mouth daily. 30 tablet 1   No current facility-administered medications on file prior to visit.     No Known Allergies  Social History   Social History  . Marital status: Single    Spouse name: N/A  . Number of children: N/A  . Years of education: N/A   Occupational History  . Not on file.   Social History Main Topics  . Smoking status: Never Smoker  . Smokeless tobacco: Never Used  . Alcohol use Yes     Comment: occ  . Drug use: No  . Sexual activity: Not on file   Other Topics Concern  . Not  on file   Social History Narrative  . No narrative on file    No family history on file.  Past Surgical History:  Procedure Laterality Date  . APPENDECTOMY    . ESOPHAGOGASTRODUODENOSCOPY N/A 01/09/2014   Dr Carol Ada.  Long segment Barrett's, HH, severe portal gastropathy, GOV 2 gastric varix.    Marland Kitchen ESOPHAGOGASTRODUODENOSCOPY N/A 06/13/2016   Procedure: ESOPHAGOGASTRODUODENOSCOPY (EGD);  Surgeon: Jerene Bears, MD;  Location[facility] ENDOSCOPY;  Service: Endoscopy;  Laterality: N/A;    ROS: Review of Systems As stated above  PHYSICAL EXAM: BP (!) 157/87   Pulse 94   Temp 98.5 F (36.9 C) (Oral)   Resp 16   Wt 276 lb 3.2 oz (125.3 kg)   SpO2 95%   BMI 39.63 kg/m   148/68 Wt Readings from Last 3 Encounters:  07/10/16 276 lb 3.2 oz (125.3 kg)  06/26/16 264 lb 12.8 oz (120.1 kg)  06/22/16 276 lb 6.4 oz (125.4 kg)   Physical Exam General appearance -middle-aged Caucasian male in NAD Mental status -alert and oriented 3 Eyes - mild to moderate scleral icterus Neck - no cervical lymphadenopathy. No thyroid enlargement.  Chest - clear to auscultation, no wheezes, rales or rhonchi, symmetric air entry Heart - normal rate, regular rhythm, normal S1, S2, no murmurs, rubs, clicks or gallops Extremities - 2+ lower extremity edema.  Depression screen PHQ 2/9 07/10/2016  Decreased Interest 0  Down, Depressed, Hopeless 0  PHQ - 2 Score 0   GAD 7 : Generalized Anxiety Score 07/10/2016 06/26/2016  Nervous, Anxious, on Edge 0 0  Control/stop worrying 0 0  Worry too much - different things 1 0  Trouble relaxing 0 1  Restless 0 0  Easily annoyed or irritable 1 1  Afraid - awful might happen 0 1  Total GAD 7 Score 2 3   Results for orders placed or performed in visit on 07/10/16  POCT glycosylated hemoglobin (Hb A1C)  Result Value Ref Range   Hemoglobin A1C 5.0     ASSESSMENT AND PLAN: 1. Alcoholic hepatitis with ascites -Commended him on sobriety. He will continue to follow-up  with Dr. Benson Norway. I do not think any definite plans made as yet to put him on transplant list  2. 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 I have told him to cut back on the midodrine to twice a day.  He will try to check blood pressure twice a week  3. Elevated blood pressure reading -See #2 above  4. Diabetes mellitus screening - POCT glycosylated hemoglobin (Hb A1C)  Patient was given the opportunity to ask questions.  Patient verbalized understanding of the plan and was able  to repeat key elements of the plan.   Orders Placed This Encounter  Procedures  . Ambulatory referral to Nephrology  . POCT glycosylated hemoglobin (Hb A1C)     Requested Prescriptions    No prescriptions requested or ordered in this encounter    Return in about 3 months (around 10/10/2016).  Karle Plumber, MD, FACP

## 2016-07-20 ENCOUNTER — Other Ambulatory Visit: Payer: Self-pay

## 2016-07-20 MED ORDER — RIFAXIMIN 550 MG PO TABS: 550.0000 mg | ORAL_TABLET | Freq: Two times a day (BID) | ORAL | 3 refills | Status: AC

## 2016-07-21 MED FILL — MIDODRINE HCL 10 MG TABLET: 10 | 30 days supply | Qty: 90 | Fill #0

## 2016-07-24 ENCOUNTER — Ambulatory Visit: Payer: Self-pay | Attending: Family Medicine | Admitting: Family Medicine

## 2016-07-24 ENCOUNTER — Encounter: Payer: Self-pay | Admitting: Family Medicine

## 2016-07-24 VITALS — BP 117/82 | HR 97 | Temp 98.8°F | Resp 18 | Ht 70.0 in | Wt 278.0 lb

## 2016-07-24 DIAGNOSIS — K922 Gastrointestinal hemorrhage, unspecified: Secondary | ICD-10-CM | POA: Insufficient documentation

## 2016-07-24 DIAGNOSIS — F1010 Alcohol abuse, uncomplicated: Secondary | ICD-10-CM | POA: Insufficient documentation

## 2016-07-24 DIAGNOSIS — I10 Essential (primary) hypertension: Secondary | ICD-10-CM | POA: Insufficient documentation

## 2016-07-24 DIAGNOSIS — D689 Coagulation defect, unspecified: Secondary | ICD-10-CM | POA: Insufficient documentation

## 2016-07-24 DIAGNOSIS — H44001 Unspecified purulent endophthalmitis, right eye: Secondary | ICD-10-CM | POA: Insufficient documentation

## 2016-07-24 DIAGNOSIS — K766 Portal hypertension: Secondary | ICD-10-CM | POA: Insufficient documentation

## 2016-07-24 DIAGNOSIS — K7010 Alcoholic hepatitis without ascites: Secondary | ICD-10-CM | POA: Insufficient documentation

## 2016-07-24 DIAGNOSIS — K7031 Alcoholic cirrhosis of liver with ascites: Secondary | ICD-10-CM | POA: Insufficient documentation

## 2016-07-24 MED ORDER — CEPHALEXIN 500 MG PO CAPS: 500.0000 mg | ORAL_CAPSULE | Freq: Four times a day (QID) | ORAL | 0 refills | Status: DC

## 2016-07-24 MED ORDER — CEPHALEXIN 500 MG PO CAPS: 500.0000 mg | ORAL_CAPSULE | Freq: Two times a day (BID) | ORAL | 0 refills | Status: DC

## 2016-07-24 MED FILL — CEPHALEXIN 500 MG CAPSULE: 500 | 10 days supply | Qty: 20 | Fill #0

## 2016-07-24 NOTE — Addendum Note (Signed)
Addended by: Arnoldo Morale on: 07/24/2016 05:16 PM   Modules accepted: Orders

## 2016-07-24 NOTE — Progress Notes (Addendum)
Subjective:  Patient ID: Colin Hodges, male    DOB: [birth year]  Age: 46 y.o. MRN: [id number]  CC: Abscess   HPI Colin Hodges is a 46 year old male with history of alcoholic liver cirrhosis, portal hypertension with hospitalization for upper GI bleed and coagulopathy in 06/2016 who presents today with a six-day history of a right eye abscess. He recalls having a bug bite 6 days ago with subsequently enlargement of lesion and this morning the patient popped with production of purulent discharge. He complains of headaches and pain at the site but no fevers.  Past Medical History:  Diagnosis Date  . Alcoholic hepatitis 9024, 10/7351   Discriminant function score 53 06/13/16: started 28 day course Prednisolone.    . Anemia 06/2016   in setting of GI bleed with melena  . Ascites 2015  . Cirrhosis, alcoholic ([place]) 2992  . Coagulopathy ([place]) 2015 and 06/2016.  Marland Kitchen Hypertension     Past Surgical History:  Procedure Laterality Date  . APPENDECTOMY    . ESOPHAGOGASTRODUODENOSCOPY N/A 01/09/2014   Dr Carol Ada.  Long segment Barrett's, HH, severe portal gastropathy, GOV 2 gastric varix.    Marland Kitchen ESOPHAGOGASTRODUODENOSCOPY N/A 06/13/2016   Procedure: ESOPHAGOGASTRODUODENOSCOPY (EGD);  Surgeon: Jerene Bears, MD;  Location[facility]COPY;  Service: Endoscopy;  Laterality: N/A;    No Known Allergies   Outpatient Medications Prior to Visit  Medication Sig Dispense Refill  . Cholecalciferol (VITAMIN D3) 5000 UNITS TABS Take 1 tablet by mouth 3 (three) times a week.    . folic acid (FOLVITE) 1 MG tablet Take 1 tablet (1 mg total) by mouth daily. 30 tablet 0  . furosemide (LASIX) 40 MG tablet Take 1 tablet (40 mg total) by mouth daily. 30 tablet 2  . lactulose (CHRONULAC) 10 GM/15ML solution Take 30 mLs (20 g total) by mouth 3 (three) times daily. 240 mL 0  . midodrine (PROAMATINE) 10 MG tablet Take 1 tablet (10 mg total) by mouth 3 (three) times daily with meals. 90 tablet 1  . ondansetron (ZOFRAN) 4  MG tablet Take 1 tablet (4 mg total) by mouth every 6 (six) hours as needed for nausea. (Patient not taking: Reported on 06/13/2016) 20 tablet 0  . pantoprazole (PROTONIX) 40 MG tablet Take 1 tablet (40 mg total) by mouth 2 (two) times daily. (Patient not taking: Reported on 06/13/2016) 60 tablet 1  . rifaximin (XIFAXAN) 550 MG TABS tablet Take 1 tablet (550 mg total) by mouth 2 (two) times daily. 180 tablet 3  . thiamine 100 MG tablet Take 1 tablet (100 mg total) by mouth daily. 30 tablet 1   No facility-administered medications prior to visit.     ROS Review of Systems  Constitutional: Negative for activity change and appetite change.  HENT: Negative for sinus pressure and sore throat.   Respiratory: Negative for chest tightness, shortness of breath and wheezing.   Cardiovascular: Negative for chest pain and palpitations.  Gastrointestinal: Negative for abdominal distention, abdominal pain and constipation.  Genitourinary: Negative.   Musculoskeletal: Negative.   Skin:       See hpi  Psychiatric/Behavioral: Negative for behavioral problems and dysphoric mood.    Objective:  BP 117/82 (BP Location: Left Arm, Patient Position: Sitting, Cuff Size: Normal)   Pulse 97   Temp 98.8 F (37.1 C) (Oral)   Resp 18   Ht 5' 10"  (1.778 m)   Wt 278 lb (126.1 kg)   SpO2 98%   BMI 39.89 kg/m   BP/Weight  07/24/2016 07/10/2016 4/82/7078  Systolic BP 675 449 201  Diastolic BP 82 87 74  Wt. (Lbs) 278 276.2 264.8  BMI 39.89 39.63 37.99      Physical Exam  Constitutional: He is oriented to person, place, and time. He appears well-developed and well-nourished.  Eyes:  Icteric  Cardiovascular: Normal rate, normal heart sounds and intact distal pulses.   No murmur heard. Pulmonary/Chest: Effort normal and breath sounds normal. He has no wheezes. He has no rales. He exhibits no tenderness.  Abdominal: Soft. Bowel sounds are normal. He exhibits no distension and no mass. There is no tenderness.    Musculoskeletal: Normal range of motion.  Neurological: He is alert and oriented to person, place, and time.  Skin:  2 x 2 centimeter erythematous induration on the lateral aspect of right eye     Assessment & Plan:   1. Abscess of right eye Incision and drainage performed of the lesion with production of purulent drainage and associated bleeding Pressure applied for 5 minutes and dressing changes 3 prior to discharge Patient coherent and tolerated procedure well. Spoke with the pharmacist who promised to fill his prescription today prior to his leaving Discussed ED and return protocol to the patient - he knows to present to the ED if bleeding resumes as he is high risk given INR of 2.24 secondary to chronic liver disease. - cephALEXin (KEFLEX) 500 MG capsule; Take 1 capsule (500 mg total) by mouth 4 (four) times daily.  Dispense: 20 capsule; Refill: 0   Meds ordered this encounter  Medications  . cephALEXin (KEFLEX) 500 MG capsule    Sig: Take 1 capsule (500 mg total) by mouth 4 (four) times daily.    Dispense:  20 capsule    Refill:  0    Follow-up: Return in about 3 days (around 07/27/2016) for Follow-up for right eye abscess.   Arnoldo Morale MD

## 2016-07-27 ENCOUNTER — Ambulatory Visit: Payer: Self-pay | Attending: Family Medicine | Admitting: Family Medicine

## 2016-07-27 VITALS — BP 144/76 | HR 92 | Temp 98.3°F | Resp 18 | Ht 70.0 in | Wt 273.8 lb

## 2016-07-27 DIAGNOSIS — K7030 Alcoholic cirrhosis of liver without ascites: Secondary | ICD-10-CM | POA: Insufficient documentation

## 2016-07-27 DIAGNOSIS — H44001 Unspecified purulent endophthalmitis, right eye: Secondary | ICD-10-CM | POA: Insufficient documentation

## 2016-07-27 DIAGNOSIS — K7460 Unspecified cirrhosis of liver: Secondary | ICD-10-CM

## 2016-07-27 DIAGNOSIS — I10 Essential (primary) hypertension: Secondary | ICD-10-CM | POA: Insufficient documentation

## 2016-07-27 DIAGNOSIS — R030 Elevated blood-pressure reading, without diagnosis of hypertension: Secondary | ICD-10-CM

## 2016-07-27 DIAGNOSIS — K7290 Hepatic failure, unspecified without coma: Secondary | ICD-10-CM

## 2016-07-27 DIAGNOSIS — K766 Portal hypertension: Secondary | ICD-10-CM | POA: Insufficient documentation

## 2016-07-27 DIAGNOSIS — Z79899 Other long term (current) drug therapy: Secondary | ICD-10-CM | POA: Insufficient documentation

## 2016-07-27 NOTE — Progress Notes (Signed)
Subjective:  Patient ID: Colin Hodges, male    DOB: 03/15/1970  Age: 46 y.o. MRN: [id number]  CC: Follow-up   HPI Colin Hodges is a 46 year old male with history of alcoholic liver cirrhosis, portal hypertension with hospitalization for upper GI bleed and coagulopathy in 06/2016 who presents today for a follow up of a right eye abscess s/p I&D at his lastOffice visit with me after he had presented with a six-day history of a right eye abscess.  He recalls having a bug bite 6 days prior to presentation with subsequent enlargement of lesion and later popping of the lesion with production of purulent discharge.  He has been on a course of Keflex and reports improvement in symptoms. Bleeding has stopped and he denies any additional drainage.  Past Medical History:  Diagnosis Date  . Alcoholic hepatitis 3094, 08/6806   Discriminant function score 53 06/13/16: started 28 day course Prednisolone.    . Anemia 06/2016   in setting of GI bleed with melena  . Ascites 2015  . Cirrhosis, alcoholic ([place]) 8110  . Coagulopathy ([place]) 2015 and 06/2016.  Marland Kitchen Hypertension     Past Surgical History:  Procedure Laterality Date  . APPENDECTOMY    . ESOPHAGOGASTRODUODENOSCOPY N/A 01/09/2014   Dr Carol Ada.  Long segment Barrett's, HH, severe portal gastropathy, GOV 2 gastric varix.    Marland Kitchen ESOPHAGOGASTRODUODENOSCOPY N/A 06/13/2016   Procedure: ESOPHAGOGASTRODUODENOSCOPY (EGD);  Surgeon: Jerene Bears, MD;  Location: [facility] ENDOSCOPY;  Service: Endoscopy;  Laterality: N/A;    No Known Allergies   Outpatient Medications Prior to Visit  Medication Sig Dispense Refill  . cephALEXin (KEFLEX) 500 MG capsule Take 1 capsule (500 mg total) by mouth 2 (two) times daily. 20 capsule 0  . Cholecalciferol (VITAMIN D3) 5000 UNITS TABS Take 1 tablet by mouth 3 (three) times a week.    . folic acid (FOLVITE) 1 MG tablet Take 1 tablet (1 mg total) by mouth daily. 30 tablet 0  . furosemide (LASIX) 40 MG tablet Take 1 tablet (40  mg total) by mouth daily. 30 tablet 2  . lactulose (CHRONULAC) 10 GM/15ML solution Take 30 mLs (20 g total) by mouth 3 (three) times daily. 240 mL 0  . midodrine (PROAMATINE) 10 MG tablet Take 1 tablet (10 mg total) by mouth 3 (three) times daily with meals. 90 tablet 1  . ondansetron (ZOFRAN) 4 MG tablet Take 1 tablet (4 mg total) by mouth every 6 (six) hours as needed for nausea. (Patient not taking: Reported on 06/13/2016) 20 tablet 0  . pantoprazole (PROTONIX) 40 MG tablet Take 1 tablet (40 mg total) by mouth 2 (two) times daily. (Patient not taking: Reported on 06/13/2016) 60 tablet 1  . rifaximin (XIFAXAN) 550 MG TABS tablet Take 1 tablet (550 mg total) by mouth 2 (two) times daily. 180 tablet 3  . thiamine 100 MG tablet Take 1 tablet (100 mg total) by mouth daily. 30 tablet 1   No facility-administered medications prior to visit.     ROS Review of Systems  Constitutional: Negative for activity change and appetite change.  HENT: Negative for sinus pressure and sore throat.   Respiratory: Negative for chest tightness, shortness of breath and wheezing.   Cardiovascular: Negative for chest pain and palpitations.  Gastrointestinal: Negative for abdominal distention, abdominal pain and constipation.  Genitourinary: Negative.   Musculoskeletal: Negative.   Skin:       See hpi  Psychiatric/Behavioral: Negative for behavioral problems and dysphoric mood.  Objective:  BP (!) 144/76 (BP Location: Left Arm, Patient Position: Sitting, Cuff Size: Normal)   Pulse 92   Temp 98.3 F (36.8 C) (Oral)   Resp 18   Ht 5' 10"  (1.778 m)   Wt 273 lb 12.8 oz (124.2 kg)   SpO2 99%   BMI 39.29 kg/m   BP/Weight 07/27/2016 2/67/1245 8/0/9983  Systolic BP 382 505 397  Diastolic BP 76 82 87  Wt. (Lbs) 273.8 278 276.2  BMI 39.29 39.89 39.63      Physical Exam Constitutional: He is oriented to person, place, and time. He appears well-developed and well-nourished.  Eyes:  Icteric  Cardiovascular:  Normal rate, normal heart sounds and intact distal pulses.   No murmur heard. Pulmonary/Chest: Effort normal and breath sounds normal. He has no wheezes. He has no rales. He exhibits no tenderness.  Abdominal: Soft. Bowel sounds are normal. He exhibits mild abdominal distension and no mass. There is no tenderness.  Musculoskeletal: Normal range of motion.  Neurological: He is alert and oriented to person, place, and time.  Skin:   lateral aspect of right eye with abscess cavity, no discharge, no surrounding induration  or erythema  Assessment & Plan:   1. Abscess of right eye Status post incision and drainage Packed with iodoform gauze and dressing applied Continue Keflex See PCP for follow-up and continuity of care  2. Decompensated hepatic cirrhosis (HCC) Alcoholic hepatic cirrhosis with portal hypertension MELD score of 31 Continue lactulose, Protonix, furosemide and Xifaxan Addition of spironolactone could be a consideration-Will defer to PCP Abstain from alcohol  3. Elevated blood pressure Currently on midodrine He has had low blood pressures in the past May need to discontinue midodrine if blood pressure continues to hold up.    No orders of the defined types were placed in this encounter.   Follow-up: 4 days for follow-up of abscess and to establish care with PCP  This note has been created with Surveyor, quantity. Any transcriptional errors are unintentional.     Arnoldo Morale MD

## 2016-07-27 NOTE — Patient Instructions (Signed)

## 2016-07-27 NOTE — Progress Notes (Signed)
Patient is here for f/up right eye abscess

## 2016-07-28 ENCOUNTER — Encounter: Payer: Self-pay | Admitting: Family Medicine

## 2016-07-30 ENCOUNTER — Encounter: Payer: Self-pay | Admitting: Internal Medicine

## 2016-07-30 ENCOUNTER — Ambulatory Visit: Payer: Self-pay | Attending: Internal Medicine | Admitting: Internal Medicine

## 2016-07-30 VITALS — BP 141/74 | HR 88 | Temp 98.2°F | Resp 16 | Wt 270.0 lb

## 2016-07-30 DIAGNOSIS — K7460 Unspecified cirrhosis of liver: Secondary | ICD-10-CM | POA: Insufficient documentation

## 2016-07-30 DIAGNOSIS — F1010 Alcohol abuse, uncomplicated: Secondary | ICD-10-CM | POA: Insufficient documentation

## 2016-07-30 DIAGNOSIS — K766 Portal hypertension: Secondary | ICD-10-CM | POA: Insufficient documentation

## 2016-07-30 DIAGNOSIS — I8500 Esophageal varices without bleeding: Secondary | ICD-10-CM | POA: Insufficient documentation

## 2016-07-30 DIAGNOSIS — R030 Elevated blood-pressure reading, without diagnosis of hypertension: Secondary | ICD-10-CM | POA: Insufficient documentation

## 2016-07-30 DIAGNOSIS — K767 Hepatorenal syndrome: Secondary | ICD-10-CM | POA: Insufficient documentation

## 2016-07-30 DIAGNOSIS — L03213 Periorbital cellulitis: Secondary | ICD-10-CM | POA: Insufficient documentation

## 2016-07-30 DIAGNOSIS — H44001 Unspecified purulent endophthalmitis, right eye: Secondary | ICD-10-CM

## 2016-07-30 DIAGNOSIS — N179 Acute kidney failure, unspecified: Secondary | ICD-10-CM | POA: Insufficient documentation

## 2016-07-30 DIAGNOSIS — D689 Coagulation defect, unspecified: Secondary | ICD-10-CM | POA: Insufficient documentation

## 2016-07-30 DIAGNOSIS — K7011 Alcoholic hepatitis with ascites: Secondary | ICD-10-CM | POA: Insufficient documentation

## 2016-07-30 NOTE — Patient Instructions (Addendum)
Change dressing to LT eye daily until completely healed.  Finish antibiotic. Follow up if increase drainage, redness or pain.   Decrease Midodrine to 5 mg twice a day.

## 2016-07-30 NOTE — Progress Notes (Signed)
Patient ID: Colin Hodges, male    DOB: 09-01-1970  MRN: [id number]  CC: Follow-up   Subjective: Colin Hodges is a 46 y.o. male who presents for UC visit for cellulitis close to the right eye His concerns today include:  Patient saw Dr. Jarold Song last week with an abscess close to the right eye. It was I&D. Saw her in follow-up 2-3 days later and I&D site was repacked.  He is completing a course of Keflex of which she has about 2-3 more days.  Today he reports that the pain redness and swelling around the area has significantly decreased. He is unable to tell whether there is any drainage on the packing material. He has been cleaning the area with peroxide.  Patient Active Problem List   Diagnosis Date Noted  . Diabetes mellitus screening 07/10/2016  . Alcoholic hepatitis without ascites   . Portal hypertension ([place])   . AKI (acute kidney injury) ([place])   . Melena   . Cirrhosis ([place]) 06/12/2016  . Decompensated hepatic cirrhosis ([place]) 06/12/2016  . Hepatorenal syndrome ([place]) 06/12/2016  . Esophageal varices ([place]) 06/12/2016  . Acute blood loss anemia 06/12/2016  . Coagulopathy ([place]) 06/12/2016  . Abnormal LFTs   . Alcoholic hepatitis with ascites   . Ascites 01/08/2014  . GI bleed 01/08/2014  . Alcohol abuse 01/08/2014  . Elevated bilirubin 01/08/2014  . Nausea & vomiting 01/08/2014  . UTI (urinary tract infection) 01/08/2014     Current Outpatient Prescriptions on File Prior to Visit  Medication Sig Dispense Refill  . cephALEXin (KEFLEX) 500 MG capsule Take 1 capsule (500 mg total) by mouth 2 (two) times daily. 20 capsule 0  . Cholecalciferol (VITAMIN D3) 5000 UNITS TABS Take 1 tablet by mouth 3 (three) times a week.    . folic acid (FOLVITE) 1 MG tablet Take 1 tablet (1 mg total) by mouth daily. 30 tablet 0  . furosemide (LASIX) 40 MG tablet Take 1 tablet (40 mg total) by mouth daily. 30 tablet 2  . lactulose (CHRONULAC) 10 GM/15ML solution Take 30 mLs (20 g total) by mouth 3  (three) times daily. 240 mL 0  . midodrine (PROAMATINE) 10 MG tablet Take 1 tablet (10 mg total) by mouth 3 (three) times daily with meals. 90 tablet 1  . ondansetron (ZOFRAN) 4 MG tablet Take 1 tablet (4 mg total) by mouth every 6 (six) hours as needed for nausea. (Patient not taking: Reported on 06/13/2016) 20 tablet 0  . pantoprazole (PROTONIX) 40 MG tablet Take 1 tablet (40 mg total) by mouth 2 (two) times daily. (Patient not taking: Reported on 06/13/2016) 60 tablet 1  . rifaximin (XIFAXAN) 550 MG TABS tablet Take 1 tablet (550 mg total) by mouth 2 (two) times daily. 180 tablet 3  . thiamine 100 MG tablet Take 1 tablet (100 mg total) by mouth daily. 30 tablet 1   No current facility-administered medications on file prior to visit.     No Known Allergies  Social History   Social History  . Marital status: Single    Spouse name: N/A  . Number of children: N/A  . Years of education: N/A   Occupational History  . Not on file.   Social History Main Topics  . Smoking status: Never Smoker  . Smokeless tobacco: Never Used  . Alcohol use Yes     Comment: occ  . Drug use: No  . Sexual activity: Not on file   Other Topics Concern  .  Not on file   Social History Narrative  . No narrative on file    No family history on file.  Past Surgical History:  Procedure Laterality Date  . APPENDECTOMY    . ESOPHAGOGASTRODUODENOSCOPY N/A 01/09/2014   Dr Carol Ada.  Long segment Barrett's, HH, severe portal gastropathy, GOV 2 gastric varix.    Marland Kitchen ESOPHAGOGASTRODUODENOSCOPY N/A 06/13/2016   Procedure: ESOPHAGOGASTRODUODENOSCOPY (EGD);  Surgeon: Jerene Bears, MD;  Location: [facility] ENDOSCOPY;  Service: Endoscopy;  Laterality: N/A;    ROS: Review of Systems As stated above PHYSICAL EXAM: BP (!) 141/74   Pulse 88   Temp 98.2 F (36.8 C) (Oral)   Resp 16   Wt 270 lb (122.5 kg)   SpO2 97%   BMI 38.74 kg/m   Physical Exam General appearance - alert, well appearing, and in no  distress Mental status - alert, oriented to person, place, and time, normal mood, behavior, speech, dress, motor activity, and thought processes Skin - packing removed from incision site lateral to the right eye. Small amount of drainage noted on the packing. Soft tissue is pink without exudates or drainage expressed at this time. Area was cleaned with normal saline and gauze. Clean 2 x 2 gauze applied. Patient instructed to do dressing changes daily as shown today until the area completely heals.   ASSESSMENT AND PLAN: 1. Abscess of right eye -Clinically improving. Patient to complete Keflex. He is to do dressing changes daily until area heals. He was given the rest of the bottle of normal saline and tape that I have used today. He has 2 x 2 gauze at home. -Patient given follow-up precautions. 2. Elevated blood pressure reading Advise patient to decrease the Midodrine to 5 mg twice a day. He sees his gastroenterologist next week. He will inquire whether he needs to remain on this medicine.  Patient was given the opportunity to ask questions.  Patient verbalized understanding of the plan and was able to repeat key elements of the plan.   No orders of the defined types were placed in this encounter.    Requested Prescriptions    No prescriptions requested or ordered in this encounter    No Follow-up on file.  Karle Plumber, MD, FACP

## 2016-08-24 MED FILL — ?PREDNISONE 5 MG TABLET: 5 MG | 7 days supply | Qty: 7 | Fill #0

## 2016-08-26 ENCOUNTER — Ambulatory Visit: Payer: Self-pay

## 2016-08-26 MED FILL — $XIFAXAN 550 MG TABLET: 550 | 3 days supply | Qty: 60 | Fill #0

## 2016-08-31 ENCOUNTER — Ambulatory Visit: Payer: Self-pay | Attending: Internal Medicine

## 2016-09-02 MED FILL — FUROSEMIDE 40 MG TABLET: 40 | 30 days supply | Qty: 30 | Fill #0

## 2016-09-02 MED FILL — LACTULOSE 10 GM/15 ML SOLN: 10 | 30 days supply | Qty: 2700 | Fill #1

## 2016-09-07 ENCOUNTER — Ambulatory Visit: Payer: Self-pay | Attending: Internal Medicine

## 2016-09-07 ENCOUNTER — Other Ambulatory Visit: Payer: Self-pay | Admitting: Internal Medicine

## 2016-09-07 DIAGNOSIS — K7030 Alcoholic cirrhosis of liver without ascites: Secondary | ICD-10-CM

## 2016-09-07 NOTE — Progress Notes (Signed)
Pt brings lab slip from GI Dr. Dirk Dress from [facility], [place]. Needs to have labs - CMP, CBC and INR. No insurance so came here to have done as lab only visit. Fax: [phone]

## 2016-09-08 ENCOUNTER — Encounter: Payer: Self-pay | Admitting: Internal Medicine

## 2016-09-08 LAB — COMPREHENSIVE METABOLIC PANEL
ALK PHOS: 51 IU/L (ref 39–117)
ALT: 11 IU/L (ref 0–44)
AST: 25 IU/L (ref 0–40)
Albumin/Globulin Ratio: 1.1 — ABNORMAL LOW (ref 1.2–2.2)
Albumin: 3.4 g/dL — ABNORMAL LOW (ref 3.5–5.5)
BUN / CREAT RATIO: 9 (ref 9–20)
BUN: 8 mg/dL (ref 6–24)
Bilirubin Total: 2.5 mg/dL — ABNORMAL HIGH (ref 0.0–1.2)
CALCIUM: 8.8 mg/dL (ref 8.7–10.2)
CO2: 20 mmol/L (ref 20–29)
CREATININE: 0.93 mg/dL (ref 0.76–1.27)
Chloride: 105 mmol/L (ref 96–106)
GFR, EST AFRICAN AMERICAN: 114 mL/min/{1.73_m2} (ref >59)
GFR, EST NON AFRICAN AMERICAN: 99 mL/min/{1.73_m2} (ref >59)
GLOBULIN, TOTAL: 3.2 g/dL (ref 1.5–4.5)
Glucose: 79 mg/dL (ref 65–99)
Potassium: 3.5 mmol/L (ref 3.5–5.2)
SODIUM: 140 mmol/L (ref 134–144)
TOTAL PROTEIN: 6.6 g/dL (ref 6.0–8.5)

## 2016-09-08 LAB — CBC
HEMATOCRIT: 28.3 % — AB (ref 37.5–51.0)
Hemoglobin: 8.8 g/dL — ABNORMAL LOW (ref 13.0–17.7)
MCH: 26.3 pg — AB (ref 26.6–33.0)
MCHC: 31.1 g/dL — AB (ref 31.5–35.7)
MCV: 85 fL (ref 79–97)
PLATELETS: 150 10*3/uL (ref 150–379)
RBC: 3.35 x10E6/uL — ABNORMAL LOW (ref 4.14–5.80)
RDW: 16.5 % — AB (ref 12.3–15.4)
WBC: 6.2 10*3/uL (ref 3.4–10.8)

## 2016-09-08 LAB — PROTIME-INR
INR: 1.4 — ABNORMAL HIGH (ref 0.8–1.2)
Prothrombin Time: 14.5 s — ABNORMAL HIGH (ref 9.1–12.0)

## 2016-09-08 NOTE — Progress Notes (Signed)
Patient came yesterday with a lab slip request from his gastroenterologist Dr. Carol Ada of [facility]. Patient has [facility] card and requested labs to be done here. Requested studies were complete metabolic panel, CBC, PT/INR. Results are shown below. CMA will inform patient of these results and will fax results to Dr. Benson Norway. Fax [phone]. Results for orders placed or performed in visit on 09/07/16  Comprehensive metabolic panel  Result Value Ref Range   Glucose 79 65 - 99 mg/dL   BUN 8 6 - 24 mg/dL   Creatinine, Ser 0.93 0.76 - 1.27 mg/dL   GFR calc non Af Amer 99 >59 mL/min/1.73   GFR calc Af Amer 114 >59 mL/min/1.73   BUN/Creatinine Ratio 9 9 - 20   Sodium 140 134 - 144 mmol/L   Potassium 3.5 3.5 - 5.2 mmol/L   Chloride 105 96 - 106 mmol/L   CO2 20 20 - 29 mmol/L   Calcium 8.8 8.7 - 10.2 mg/dL   Total Protein 6.6 6.0 - 8.5 g/dL   Albumin 3.4 (L) 3.5 - 5.5 g/dL   Globulin, Total 3.2 1.5 - 4.5 g/dL   Albumin/Globulin Ratio 1.1 (L) 1.2 - 2.2   Bilirubin Total 2.5 (H) 0.0 - 1.2 mg/dL   Alkaline Phosphatase 51 39 - 117 IU/L   AST 25 0 - 40 IU/L   ALT 11 0 - 44 IU/L  CBC  Result Value Ref Range   WBC 6.2 3.4 - 10.8 x10E3/uL   RBC 3.35 (L) 4.14 - 5.80 x10E6/uL   Hemoglobin 8.8 (L) 13.0 - 17.7 g/dL   Hematocrit 28.3 (L) 37.5 - 51.0 %   MCV 85 79 - 97 fL   MCH 26.3 (L) 26.6 - 33.0 pg   MCHC 31.1 (L) 31.5 - 35.7 g/dL   RDW 16.5 (H) 12.3 - 15.4 %   Platelets 150 150 - 379 x10E3/uL  Protime-INR  Result Value Ref Range   INR 1.4 (H) 0.8 - 1.2   Prothrombin Time 14.5 (H) 9.1 - 12.0 sec

## 2016-10-02 MED FILL — LACTULOSE 10 GM/15 ML SOLN: 10 | 30 days supply | Qty: 2700 | Fill #2

## 2016-10-02 MED FILL — $XIFAXAN 550 MG TABLET: 550 | 3 days supply | Qty: 60 | Fill #1

## 2016-10-02 MED FILL — FUROSEMIDE 40 MG TABLET: 40 | 30 days supply | Qty: 30 | Fill #1

## 2016-10-11 ENCOUNTER — Encounter: Payer: Self-pay | Admitting: Internal Medicine

## 2016-10-13 ENCOUNTER — Ambulatory Visit: Payer: Self-pay | Attending: Internal Medicine | Admitting: Internal Medicine

## 2016-10-13 ENCOUNTER — Encounter: Payer: Self-pay | Admitting: Internal Medicine

## 2016-10-13 VITALS — BP 122/60 | HR 94 | Temp 98.5°F | Ht 70.0 in | Wt 250.4 lb

## 2016-10-13 DIAGNOSIS — D689 Coagulation defect, unspecified: Secondary | ICD-10-CM | POA: Insufficient documentation

## 2016-10-13 DIAGNOSIS — K7011 Alcoholic hepatitis with ascites: Secondary | ICD-10-CM | POA: Insufficient documentation

## 2016-10-13 DIAGNOSIS — R600 Localized edema: Secondary | ICD-10-CM | POA: Insufficient documentation

## 2016-10-13 DIAGNOSIS — K7031 Alcoholic cirrhosis of liver with ascites: Secondary | ICD-10-CM | POA: Insufficient documentation

## 2016-10-13 DIAGNOSIS — F1011 Alcohol abuse, in remission: Secondary | ICD-10-CM | POA: Insufficient documentation

## 2016-10-13 DIAGNOSIS — D649 Anemia, unspecified: Secondary | ICD-10-CM | POA: Insufficient documentation

## 2016-10-13 DIAGNOSIS — K766 Portal hypertension: Secondary | ICD-10-CM | POA: Insufficient documentation

## 2016-10-13 DIAGNOSIS — I8511 Secondary esophageal varices with bleeding: Secondary | ICD-10-CM | POA: Insufficient documentation

## 2016-10-13 DIAGNOSIS — K7030 Alcoholic cirrhosis of liver without ascites: Secondary | ICD-10-CM

## 2016-10-13 DIAGNOSIS — K767 Hepatorenal syndrome: Secondary | ICD-10-CM | POA: Insufficient documentation

## 2016-10-13 DIAGNOSIS — R5383 Other fatigue: Secondary | ICD-10-CM | POA: Insufficient documentation

## 2016-10-13 DIAGNOSIS — Z23 Encounter for immunization: Secondary | ICD-10-CM

## 2016-10-13 NOTE — Progress Notes (Signed)
Patient ID: Colin Hodges, male    DOB: [birth year]  MRN: [id number]  CC: GI Bleeding   Subjective: Colin Hodges is a 46 y.o. male who presents for chronic ds management His concerns today include: 46 year old with history of alcoholic cirrhosis with associated portal hypertension, past hx of GI bleed from esophageal varices, hepatorenal syndrome and coagulopathy.  He is followed by gastroenterologist Dr. Benson Norway.  1. ETOH cirrhosis -since last visit, pt reports Prednisone and Midodrine d/c -minimal LE edema -feels endurance and general strength a lot less since hosp earlier this yr with GI bleed "Doing less than I have in past.  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 Marland Kitchen "I'm not going as hard as I use to." Assists with instructing installation crew  -H/H increased on last check to 8.8/28 from 8/23. Liver enzymes had almost normalized. Kidney function and creat had also normalized  HM: does not want flu Okay with getting Tdap.   Patient Active Problem List   Diagnosis Date Noted  . Alcoholic hepatitis without ascites   . Portal hypertension ([place])   . Cirrhosis ([place]) 06/12/2016  . Hepatorenal syndrome ([place]) 06/12/2016  . Esophageal varices ([place]) 06/12/2016  . Coagulopathy ([place]) 06/12/2016  . Alcohol abuse, in remission 01/08/2014     Current Outpatient Prescriptions on File Prior to Visit  Medication Sig Dispense Refill  . Cholecalciferol (VITAMIN D3) 5000 UNITS TABS Take 1 tablet by mouth 3 (three) times a week.    . furosemide (LASIX) 40 MG tablet Take 1 tablet (40 mg total) by mouth daily. 30 tablet 2  . lactulose (CHRONULAC) 10 GM/15ML solution Take 30 mLs (20 g total) by mouth 3 (three) times daily. 240 mL 0  . pantoprazole (PROTONIX) 40 MG tablet Take 1 tablet (40 mg total) by mouth 2 (two) times daily. 60 tablet 1  . rifaximin (XIFAXAN) 550 MG TABS tablet Take 1 tablet (550 mg total) by mouth 2 (two) times daily. 180  tablet 3   No current facility-administered medications on file prior to visit.     No Known Allergies  Social History   Social History  . Marital status: Single    Spouse name: N/A  . Number of children: N/A  . Years of education: N/A   Occupational History  . Not on file.   Social History Main Topics  . Smoking status: Never Smoker  . Smokeless tobacco: Never Used  . Alcohol use Yes     Comment: occ  . Drug use: No  . Sexual activity: Not on file   Other Topics Concern  . Not on file   Social History Narrative  . No narrative on file    No family history on file.  Past Surgical History:  Procedure Laterality Date  . APPENDECTOMY    . ESOPHAGOGASTRODUODENOSCOPY N/A 01/09/2014   Dr Carol Ada.  Long segment Barrett's, HH, severe portal gastropathy, GOV 2 gastric varix.    Marland Kitchen ESOPHAGOGASTRODUODENOSCOPY N/A 06/13/2016   Procedure: ESOPHAGOGASTRODUODENOSCOPY (EGD);  Surgeon: Jerene Bears, MD;  Location: [facility] ENDOSCOPY;  Service: Endoscopy;  Laterality: N/A;    ROS: Review of Systems Negative except as stated above PHYSICAL EXAM: BP 122/60   Pulse 94   Temp 98.5 F (36.9 C) (Oral)   Ht 5' 10"  (1.778 m)   Wt 250 lb 6.4 oz (113.6 kg)   SpO2 99%   BMI 35.93 kg/m   Physical Exam  General appearance - alert, well  appearing, obese middle-age Caucasian male and in no distress Mental status - alert, oriented to person, place, and time, normal mood, behavior, speech, dress, motor activity, and thought processes Chest - clear to auscultation, no wheezes, rales or rhonchi, symmetric air entry Heart - normal rate, regular rhythm, normal S1, S2, no murmurs, rubs, clicks or gallops Extremities -trace LE edema  Results for orders placed or performed in visit on 09/07/16  Comprehensive metabolic panel  Result Value Ref Range   Glucose 79 65 - 99 mg/dL   BUN 8 6 - 24 mg/dL   Creatinine, Ser 0.93 0.76 - 1.27 mg/dL   GFR calc non Af Amer 99 >59 mL/min/1.73   GFR calc Af  Amer 114 >59 mL/min/1.73   BUN/Creatinine Ratio 9 9 - 20   Sodium 140 134 - 144 mmol/L   Potassium 3.5 3.5 - 5.2 mmol/L   Chloride 105 96 - 106 mmol/L   CO2 20 20 - 29 mmol/L   Calcium 8.8 8.7 - 10.2 mg/dL   Total Protein 6.6 6.0 - 8.5 g/dL   Albumin 3.4 (L) 3.5 - 5.5 g/dL   Globulin, Total 3.2 1.5 - 4.5 g/dL   Albumin/Globulin Ratio 1.1 (L) 1.2 - 2.2   Bilirubin Total 2.5 (H) 0.0 - 1.2 mg/dL   Alkaline Phosphatase 51 39 - 117 IU/L   AST 25 0 - 40 IU/L   ALT 11 0 - 44 IU/L  CBC  Result Value Ref Range   WBC 6.2 3.4 - 10.8 x10E3/uL   RBC 3.35 (L) 4.14 - 5.80 x10E6/uL   Hemoglobin 8.8 (L) 13.0 - 17.7 g/dL   Hematocrit 28.3 (L) 37.5 - 51.0 %   MCV 85 79 - 97 fL   MCH 26.3 (L) 26.6 - 33.0 pg   MCHC 31.1 (L) 31.5 - 35.7 g/dL   RDW 16.5 (H) 12.3 - 15.4 %   Platelets 150 150 - 379 x10E3/uL  Protime-INR  Result Value Ref Range   INR 1.4 (H) 0.8 - 1.2   Prothrombin Time 14.5 (H) 9.1 - 12.0 sec     ASSESSMENT AND PLAN: 1. Alcoholic cirrhosis, unspecified whether ascites present ([place]) -Compensated. Followed by GI Dr. Benson Norway  2. Anemia, unspecified type 3. Fatigue, unspecified type -Recheck H&H today to see if it has continued to improve -Discussed iron rich foods - Iron, TIBC and Ferritin Panel - Hematocrit - Hemoglobin  4. Need for Tdap vaccination - Flu Vaccine QUAD 6+ mos PF IM (Fluarix Quad PF)   Patient was given the opportunity to ask questions.  Patient verbalized understanding of the plan and was able to repeat key elements of the plan.   Orders Placed This Encounter  Procedures  . Tdap vaccine greater than or equal to 7yo IM  . Flu Vaccine QUAD 6+ mos PF IM (Fluarix Quad PF)  . Iron, TIBC and Ferritin Panel  . Hematocrit  . Hemoglobin     Requested Prescriptions    No prescriptions requested or ordered in this encounter    Return in about 4 months (around 02/12/2017).  Karle Plumber, MD, FACP

## 2016-10-13 NOTE — Patient Instructions (Signed)
We we will recheck your blood count today to see if your anemia has improved. Medication or that you're getting in 7-8 hours of sleep.

## 2016-10-14 ENCOUNTER — Other Ambulatory Visit: Payer: Self-pay | Admitting: Internal Medicine

## 2016-10-14 LAB — IRON,TIBC AND FERRITIN PANEL
Ferritin: 13 ng/mL — ABNORMAL LOW (ref 30–400)
IRON SATURATION: 20 % (ref 15–55)
IRON: 69 ug/dL (ref 38–169)
TIBC: 346 ug/dL (ref 250–450)
UIBC: 277 ug/dL (ref 111–343)

## 2016-10-14 LAB — HEMOGLOBIN: Hemoglobin: 9.6 g/dL — ABNORMAL LOW (ref 13.0–17.7)

## 2016-10-14 LAB — HEMATOCRIT: Hematocrit: 29.8 % — ABNORMAL LOW (ref 37.5–51.0)

## 2016-10-14 MED ORDER — FERROUS SULFATE 325 (65 FE) MG PO TABS: 325.0000 mg | ORAL_TABLET | Freq: Every day | ORAL | 0 refills | Status: DC

## 2016-10-14 MED FILL — FERROUS SULFATE 325 MG TAB: 325 (65 FE) | 30 days supply | Qty: 30 | Fill #0

## 2016-10-14 NOTE — Progress Notes (Signed)
New med

## 2016-10-16 ENCOUNTER — Telehealth: Payer: Self-pay

## 2016-10-16 NOTE — Telephone Encounter (Signed)
Contacted pt to go over lab results pt is aware of results and doesn't have any questions or concerns 

## 2016-11-02 ENCOUNTER — Telehealth: Payer: Self-pay | Admitting: Internal Medicine

## 2016-11-02 DIAGNOSIS — K7030 Alcoholic cirrhosis of liver without ascites: Secondary | ICD-10-CM

## 2016-11-02 MED FILL — ?FUROSEMIDE 40 MG TABLET: 40 | 30 days supply | Qty: 30 | Fill #0

## 2016-11-02 NOTE — Telephone Encounter (Signed)
Patient came in with an order form from [facility] center with Dr. Benson Norway requesting an order for CMET,CBC, PT/INR. DX CODE k70.30. Please f/up

## 2016-11-03 NOTE — Telephone Encounter (Signed)
Pt is schedule for a lab appointment for 11-05-16 at 9am

## 2016-11-05 ENCOUNTER — Ambulatory Visit: Payer: Self-pay | Attending: Internal Medicine

## 2016-11-05 DIAGNOSIS — K7030 Alcoholic cirrhosis of liver without ascites: Secondary | ICD-10-CM | POA: Insufficient documentation

## 2016-11-05 NOTE — Progress Notes (Signed)
Patient here for lab visit only 

## 2016-11-06 LAB — COMPREHENSIVE METABOLIC PANEL
A/G RATIO: 1.1 — AB (ref 1.2–2.2)
ALK PHOS: 62 IU/L (ref 39–117)
ALT: 10 IU/L (ref 0–44)
AST: 31 IU/L (ref 0–40)
Albumin: 3.6 g/dL (ref 3.5–5.5)
BILIRUBIN TOTAL: 1.7 mg/dL — AB (ref 0.0–1.2)
BUN / CREAT RATIO: 13 (ref 9–20)
BUN: 12 mg/dL (ref 6–24)
CHLORIDE: 107 mmol/L — AB (ref 96–106)
CO2: 19 mmol/L — ABNORMAL LOW (ref 20–29)
Calcium: 9.1 mg/dL (ref 8.7–10.2)
Creatinine, Ser: 0.95 mg/dL (ref 0.76–1.27)
GFR calc non Af Amer: 96 mL/min/{1.73_m2} (ref >59)
GFR, EST AFRICAN AMERICAN: 111 mL/min/{1.73_m2} (ref >59)
GLUCOSE: 89 mg/dL (ref 65–99)
Globulin, Total: 3.4 g/dL (ref 1.5–4.5)
POTASSIUM: 3.4 mmol/L — AB (ref 3.5–5.2)
Sodium: 142 mmol/L (ref 134–144)
Total Protein: 7 g/dL (ref 6.0–8.5)

## 2016-11-06 LAB — CBC
Hematocrit: 32.6 % — ABNORMAL LOW (ref 37.5–51.0)
Hemoglobin: 10.6 g/dL — ABNORMAL LOW (ref 13.0–17.7)
MCH: 27 pg (ref 26.6–33.0)
MCHC: 32.5 g/dL (ref 31.5–35.7)
MCV: 83 fL (ref 79–97)
PLATELETS: 126 10*3/uL — AB (ref 150–379)
RBC: 3.92 x10E6/uL — AB (ref 4.14–5.80)
RDW: 18.4 % — ABNORMAL HIGH (ref 12.3–15.4)
WBC: 4.7 10*3/uL (ref 3.4–10.8)

## 2016-11-06 LAB — PROTIME-INR
INR: 1.4 — AB (ref 0.8–1.2)
Prothrombin Time: 14.6 s — ABNORMAL HIGH (ref 9.1–12.0)

## 2016-11-08 MED ORDER — POTASSIUM CHLORIDE ER 8 MEQ PO TBCR: 16.0000 meq | EXTENDED_RELEASE_TABLET | Freq: Every day | ORAL | 4 refills | Status: AC

## 2016-11-08 NOTE — Telephone Encounter (Signed)
PC placed to pt today. Pt informed K+ level is low. Likely due to Furosemide which he is taking. I recommend taking potassium supplement daily while he is on lasix. Rxn sent to [facility] pharmacy. Liver function continue to improve with dec in bil level. Advise pt to pick up copy of labs to take to his GI/Liver specialist who had requested them when he comes to pick up his K+ from pharmacy.

## 2016-11-09 ENCOUNTER — Telehealth: Payer: Self-pay | Admitting: Internal Medicine

## 2016-11-09 ENCOUNTER — Ambulatory Visit: Payer: Self-pay | Attending: Internal Medicine

## 2016-11-09 MED FILL — POTASSIUM CL ER 8 MEQ CAPSU: 8 | 30 days supply | Qty: 60 | Fill #0

## 2016-11-09 MED FILL — $XIFAXAN 550 MG TABLET: 550 | 30 days supply | Qty: 60 | Fill #0

## 2016-11-09 NOTE — Telephone Encounter (Signed)
Pt is here and wants a copy of blood work. He said that Dr Wynetta Emery called him and told him he could pick it up today. Please fu

## 2016-11-16 MED FILL — FERROUS SULFATE 325 MG TAB: 325 (65 FE) | 30 days supply | Qty: 30 | Fill #1

## 2016-12-22 MED FILL — $XIFAXAN 550 MG TABLET: 550 | 30 days supply | Qty: 60 | Fill #1

## 2016-12-22 MED FILL — ?FUROSEMIDE 40 MG TABLET: 40 | 30 days supply | Qty: 30 | Fill #1

## 2016-12-22 MED FILL — LACTULOSE Solution 10g/15ml: 10 | 30 days supply | Qty: 2700 | Fill #3

## 2016-12-24 ENCOUNTER — Other Ambulatory Visit: Payer: Self-pay | Admitting: Internal Medicine

## 2017-01-27 MED FILL — ?FUROSEMIDE 40 MG TABLET: 40 | 30 days supply | Qty: 30 | Fill #2

## 2017-01-27 MED FILL — FERROUS SULFATE 325 MG TAB: 325 (65 FE) | 30 days supply | Qty: 30 | Fill #0

## 2017-01-27 MED FILL — $XIFAXAN 550 MG TABLET: 550 | 30 days supply | Qty: 60 | Fill #2

## 2017-01-27 MED FILL — LACTULOSE Solution 10g/15ml: 10 | 30 days supply | Qty: 2700 | Fill #4

## 2017-02-12 ENCOUNTER — Ambulatory Visit: Payer: Self-pay | Attending: Internal Medicine | Admitting: Internal Medicine

## 2017-02-12 ENCOUNTER — Encounter: Payer: Self-pay | Admitting: Internal Medicine

## 2017-02-12 VITALS — BP 128/76 | HR 79 | Temp 98.2°F | Resp 16 | Wt 248.6 lb

## 2017-02-12 DIAGNOSIS — D649 Anemia, unspecified: Secondary | ICD-10-CM

## 2017-02-12 DIAGNOSIS — K7030 Alcoholic cirrhosis of liver without ascites: Secondary | ICD-10-CM

## 2017-02-12 DIAGNOSIS — K7031 Alcoholic cirrhosis of liver with ascites: Secondary | ICD-10-CM | POA: Insufficient documentation

## 2017-02-12 DIAGNOSIS — Z79899 Other long term (current) drug therapy: Secondary | ICD-10-CM | POA: Insufficient documentation

## 2017-02-12 DIAGNOSIS — Z6835 Body mass index (BMI) 35.0-35.9, adult: Secondary | ICD-10-CM | POA: Insufficient documentation

## 2017-02-12 DIAGNOSIS — E669 Obesity, unspecified: Secondary | ICD-10-CM

## 2017-02-12 NOTE — Progress Notes (Signed)
Patient ID: Colin Hodges, male    DOB: [birth year]  MRN: [id number]  CC: Follow-up   Subjective: Colin Hodges is a 47 y.o. male who presents for chronic disease management. His concerns today include:  47 year old with history of alcoholic cirrhosis with associated portal hypertension, past hx of GI bleed from esophageal varices, hepatorenal syndrome and coagulopathy. He is followed by gastroenterologist Dr. Benson [place].  1. ETOH cirrhosis: -having 4-6 soft bowel movements a day on lactulose   No blood in stool Abstains from ETOH Off Protonix. Has follow-up with his gastroenterologist in 2 weeks.  Request blood work to take with him  2. Obesity: goes to gym every day Limits fried foods, eat fruits and veggies.  No CP/SOB  3.  Anemia: H/H improved.  He is taking iron supplement Patient Active Problem List   Diagnosis Date Noted  . Alcoholic hepatitis without ascites   . Portal hypertension ([place])   . Cirrhosis ([place]) 06/12/2016  . Hepatorenal syndrome ([place]) 06/12/2016  . Esophageal varices ([place]) 06/12/2016  . Coagulopathy ([place]) 06/12/2016  . 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 Cholecalciferol (VITAMIN D3) 5000 UNITS TABS Take 1 tablet by mouth 3 (three) times a week.    . FEROSUL 325 (65 Fe) MG tablet TAKE 1 TABLET (325 MG TOTAL) BY MOUTH DAILY WITH BREAKFAST. 30 tablet 0  . furosemide (LASIX) 40 MG tablet Take 1 tablet (40 mg total) by mouth daily. 30 tablet 2  . lactulose (CHRONULAC) 10 GM/15ML solution Take 30 mLs (20 g total) by mouth 3 (three) times daily. (Patient not taking: Reported on 02/12/2017) 240 mL 0  . potassium chloride (KLOR-CON) 8 MEQ tablet Take 2 tablets (16 mEq total) by mouth daily. 60 tablet 4  . rifaximin (XIFAXAN) 550 MG TABS tablet Take 1 tablet (550 mg total) by mouth 2 (two) times daily. 180 tablet 3   No current facility-administered medications on file prior  to visit.     No Known Allergies  Social History   Socioeconomic History  . Marital status: Single    Spouse name: Not on file  . Number of children: Not on file  . Years of education: Not on file  . Highest education level: Not on file  Social Needs  . Financial resource strain: Not on file  . Food insecurity - worry: Not on file  . Food insecurity - inability: Not on file  . Transportation needs - medical: Not on file  . Transportation needs - non-medical: Not on file  Occupational History  . Not on file  Tobacco Use  . Smoking status: Never Smoker  . Smokeless tobacco: Never Used  Substance and Sexual Activity  . Alcohol use: Yes    Comment: occ  . Drug use: No  . Sexual activity: Not on file  Other Topics Concern  . Not on file  Social History Narrative  . Not on file    No family history on file.  Past Surgical History:  Procedure Laterality Date  . APPENDECTOMY    . ESOPHAGOGASTRODUODENOSCOPY N/A 01/09/2014   Dr Carol Ada.  Long segment Barrett's, HH, severe portal gastropathy, GOV 2 gastric varix.    Marland Kitchen ESOPHAGOGASTRODUODENOSCOPY N/A 06/13/2016   Procedure: ESOPHAGOGASTRODUODENOSCOPY (EGD);  Surgeon: Jerene Bears, MD;  Location: [facility] ENDOSCOPY;  Service: Endoscopy;  Laterality: N/A;    ROS: Review of Systems Neg except as above PHYSICAL EXAM:  BP 128/76   Pulse 79   Temp 98.2 F (36.8 C) (Oral)   Resp 16   Wt 248 lb 9.6 oz (112.8 kg)   SpO2 97%   BMI 35.67 kg/m   Wt Readings from Last 3 Encounters:  02/12/17 248 lb 9.6 oz (112.8 kg)  10/13/16 250 lb 6.4 oz (113.6 kg)  07/30/16 270 lb (122.5 kg)    Physical Exam  General appearance - alert, well appearing, caucasian male and in no distress Mental status - alert, oriented to person, place, and time, normal mood, behavior, speech, dress, motor activity, and thought processes Mouth - mucous membranes moist, pharynx normal without lesions Neck - supple, no significant adenopathy Chest - clear to  auscultation, no wheezes, rales or rhonchi, symmetric air entry Heart - normal rate, regular rhythm, normal S1, S2, no murmurs, rubs, clicks or gallops Extremities - peripheral pulses normal, no pedal edema, no clubbing or cyanosis   ASSESSMENT AND PLAN: 1. Alcoholic cirrhosis, unspecified whether ascites present ([place]) Stable and compensated - CBC - Comprehensive metabolic panel - Protime-INR  2. Anemia, unspecified type -improved  3. Obesity (BMI 35.0-39.9 without comorbidity) Commended him on his efforts to increase his physical activity and to eat healthy.  Encouraged him to keep up the good works.   Patient was given the opportunity to ask questions.  Patient verbalized understanding of the plan and was able to repeat key elements of the plan.   Orders Placed This Encounter  Procedures  . CBC  . Comprehensive metabolic panel  . Protime-INR     Requested Prescriptions    No prescriptions requested or ordered in this encounter    Return in about 5 months (around 07/13/2017).  Karle Plumber, MD, FACP

## 2017-02-12 NOTE — Patient Instructions (Signed)
You are doing well.  Continue healthy eating habits and regular exercise.

## 2017-02-13 LAB — PROTIME-INR
INR: 1.4 — ABNORMAL HIGH (ref 0.8–1.2)
Prothrombin Time: 14.2 s — ABNORMAL HIGH (ref 9.1–12.0)

## 2017-02-13 LAB — COMPREHENSIVE METABOLIC PANEL
ALBUMIN: 4 g/dL (ref 3.5–5.5)
ALK PHOS: 53 IU/L (ref 39–117)
ALT: 19 IU/L (ref 0–44)
AST: 31 IU/L (ref 0–40)
Albumin/Globulin Ratio: 1.3 (ref 1.2–2.2)
BUN / CREAT RATIO: 18 (ref 9–20)
BUN: 18 mg/dL (ref 6–24)
Bilirubin Total: 1.6 mg/dL — ABNORMAL HIGH (ref 0.0–1.2)
CO2: 21 mmol/L (ref 20–29)
CREATININE: 1 mg/dL (ref 0.76–1.27)
Calcium: 9.3 mg/dL (ref 8.7–10.2)
Chloride: 107 mmol/L — ABNORMAL HIGH (ref 96–106)
GFR calc non Af Amer: 90 mL/min/{1.73_m2} (ref >59)
GFR, EST AFRICAN AMERICAN: 105 mL/min/{1.73_m2} (ref >59)
GLOBULIN, TOTAL: 3.2 g/dL (ref 1.5–4.5)
Glucose: 103 mg/dL — ABNORMAL HIGH (ref 65–99)
Potassium: 3.6 mmol/L (ref 3.5–5.2)
SODIUM: 142 mmol/L (ref 134–144)
Total Protein: 7.2 g/dL (ref 6.0–8.5)

## 2017-02-13 LAB — CBC
HEMATOCRIT: 35.2 % — AB (ref 37.5–51.0)
Hemoglobin: 12.1 g/dL — ABNORMAL LOW (ref 13.0–17.7)
MCH: 29.7 pg (ref 26.6–33.0)
MCHC: 34.4 g/dL (ref 31.5–35.7)
MCV: 86 fL (ref 79–97)
Platelets: 148 10*3/uL — ABNORMAL LOW (ref 150–379)
RBC: 4.08 x10E6/uL — ABNORMAL LOW (ref 4.14–5.80)
RDW: 13.5 % (ref 12.3–15.4)
WBC: 6.2 10*3/uL (ref 3.4–10.8)

## 2017-02-22 ENCOUNTER — Telehealth: Payer: Self-pay | Admitting: Internal Medicine

## 2017-02-22 ENCOUNTER — Telehealth: Payer: Self-pay

## 2017-02-22 NOTE — Telephone Encounter (Signed)
Pt returned phone call to receive his lab results. He verified his DOB and address and he understood results

## 2017-02-22 NOTE — Telephone Encounter (Signed)
Contacted pt to go over lab results pt didn't answer left a detailed message regarding lab results on vm and if has any questions or concerns to give [place] a call   If pt calls back please give results: anemia and liver function tests continue to improve. Kidney function normal.

## 2017-03-26 ENCOUNTER — Other Ambulatory Visit: Payer: Self-pay | Admitting: Internal Medicine

## 2017-03-26 MED FILL — ?FUROSEMIDE 40 MG TABLET: 40 | 30 days supply | Qty: 30 | Fill #3

## 2017-03-26 MED FILL — LACTULOSE Solution 10g/15ml: 10 | 26 days supply | Qty: 2365 | Fill #5

## 2017-03-26 MED FILL — $XIFAXAN 550 MG TABLET: 550 | 30 days supply | Qty: 60 | Fill #3

## 2017-03-26 MED FILL — FERROUS SULFATE 325 MG TAB: 325 (65 FE) | 30 days supply | Qty: 30 | Fill #0

## 2017-04-25 IMAGING — US US ABDOMEN COMPLETE
1 series · 13 of 25 positions shown · non-contrast
Comparison: 01/09/2014

CLINICAL DATA: Jaundice

EXAM:
ABDOMEN ULTRASOUND COMPLETE

[Series 1: us abdomen complete · 0.23mm/px · 13 of 100 slices shown]
[im 1/100]
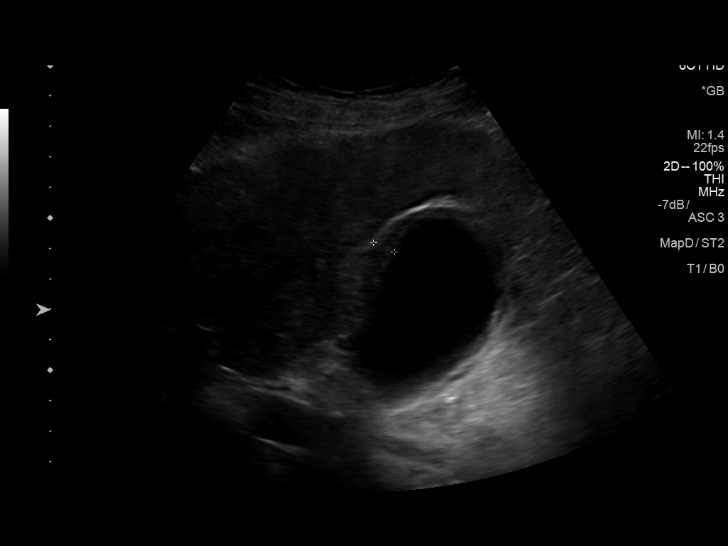
[im 9/100]
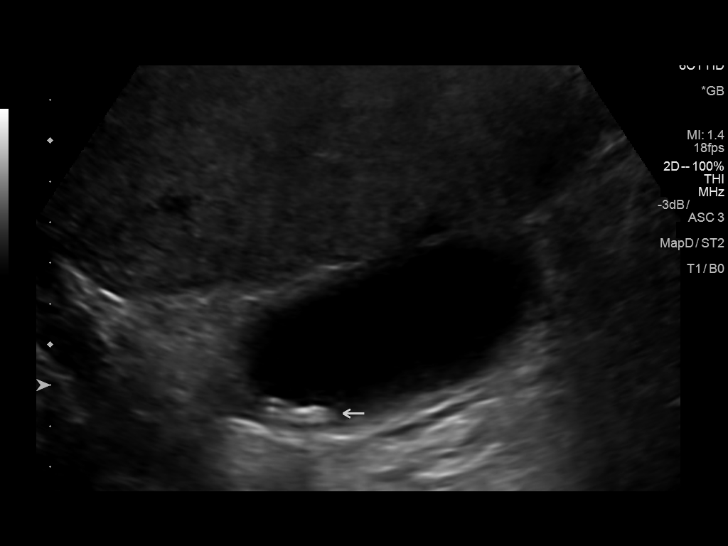
[im 17/100]
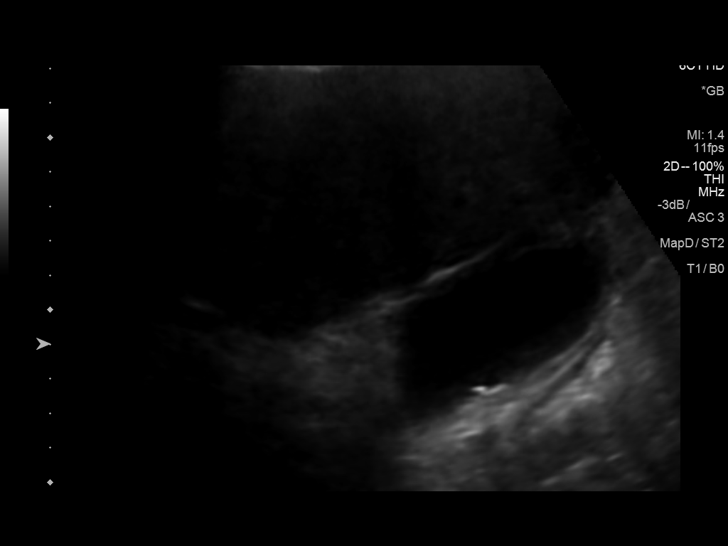
[im 25/100]
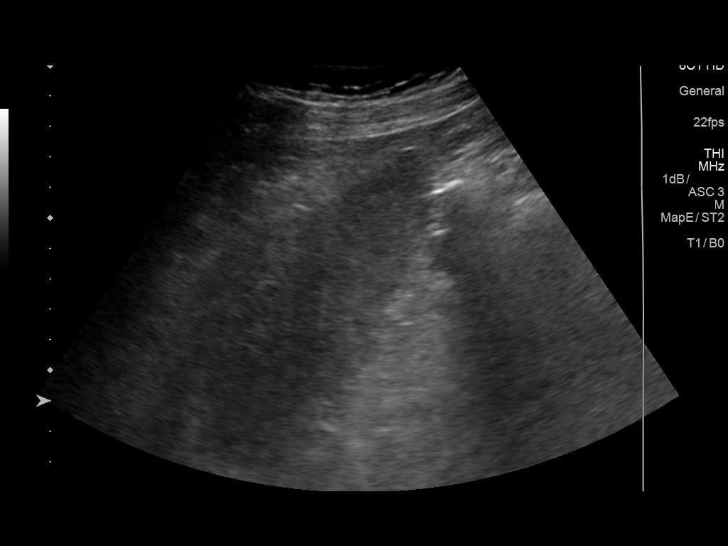
[im 34/100]
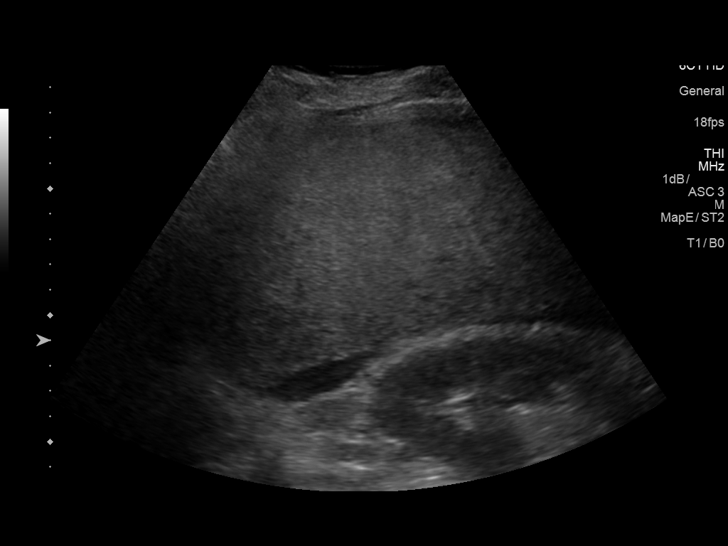
[im 42/100]
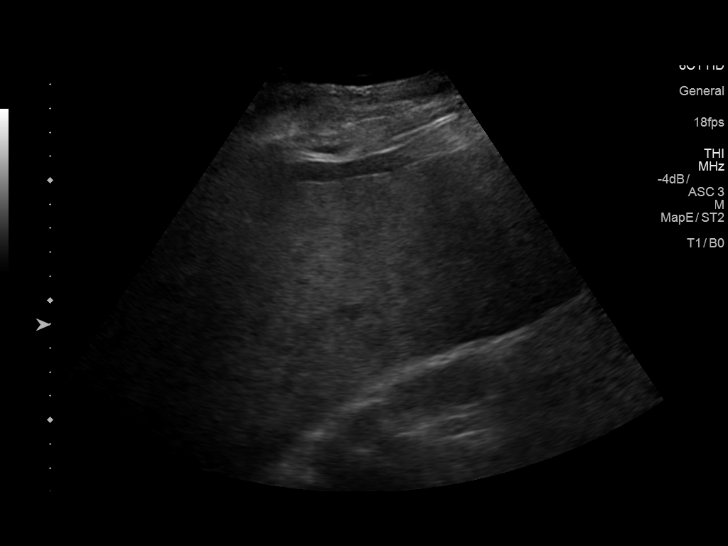
[im 50/100]
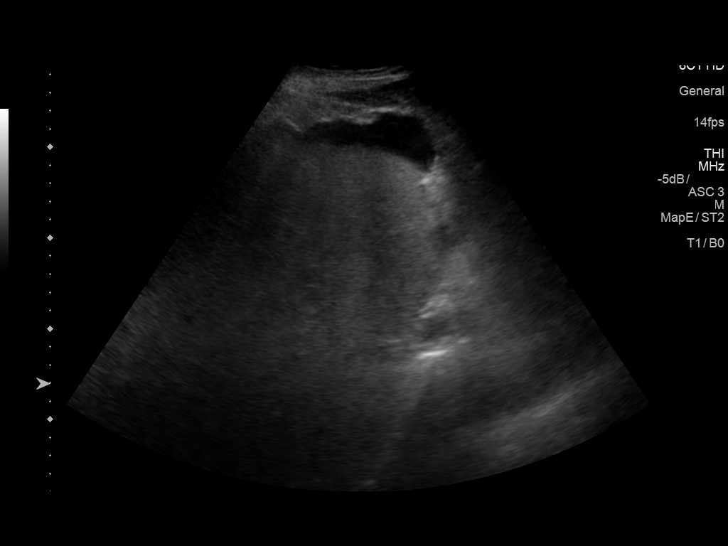
[im 58/100]
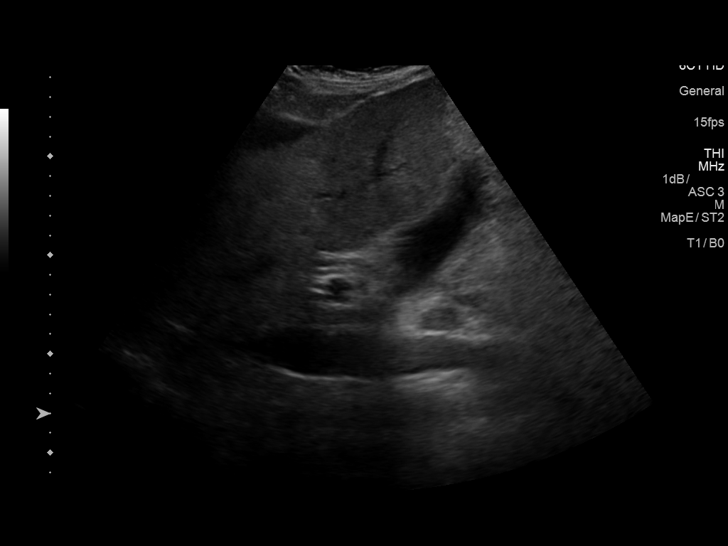
[im 67/100]
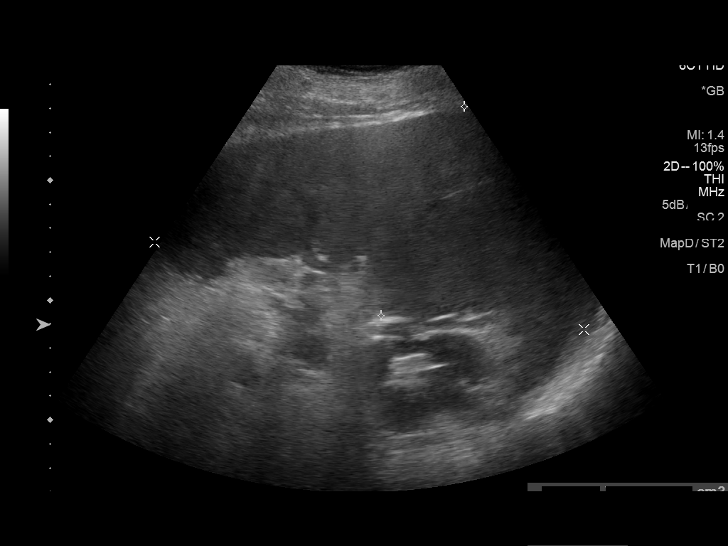
[im 75/100]
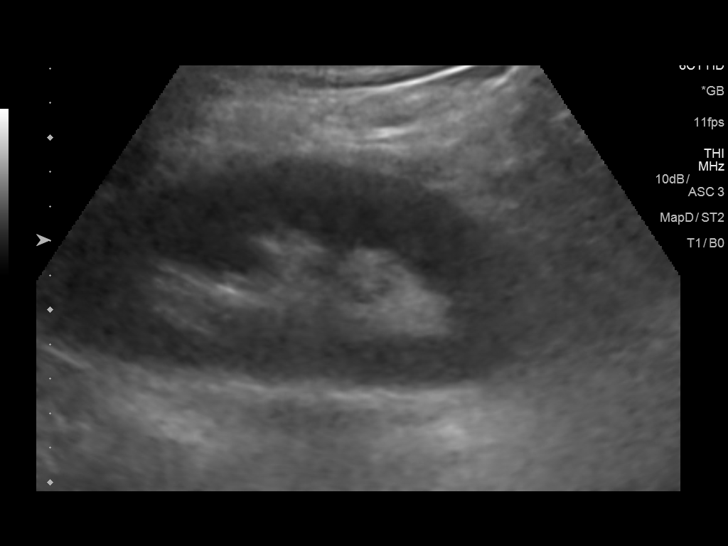
[im 83/100]
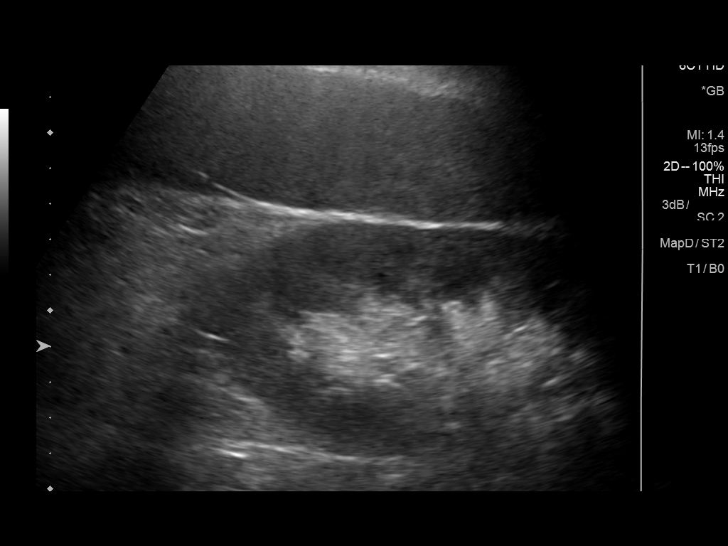
[im 91/100]
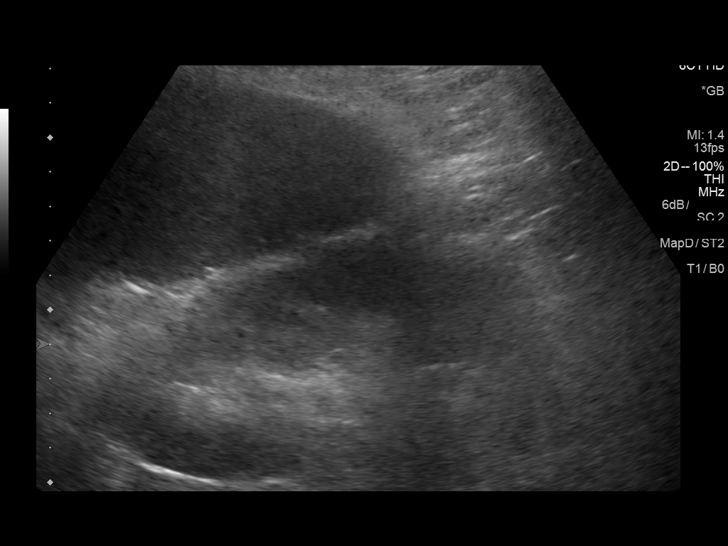
[im 100/100]
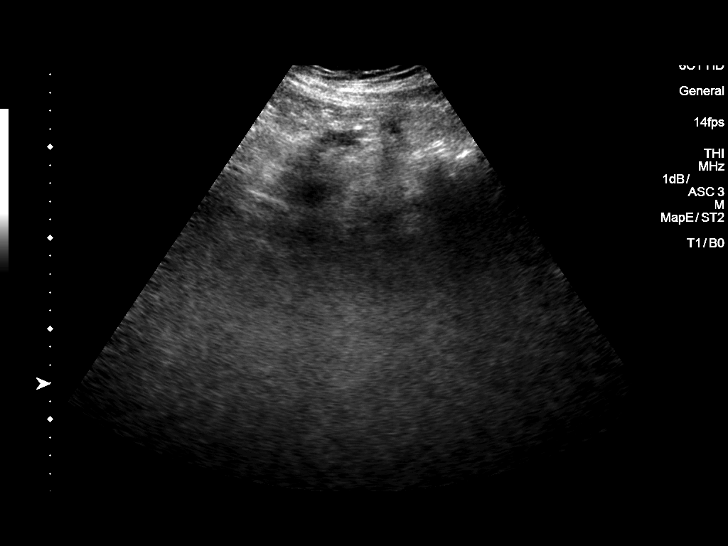

[13 of 25 positions shown; findings below may reference images not displayed]

FINDINGS: Gallbladder: Gallbladder is well distended with gallbladder wall
thickening to 7.4 mm. Multiple calculi are noted. A negative
sonographic Murphy's sign is noted.

Common bile duct: Diameter: 5.4 mm.

Liver: Diffusely increased in echogenicity consistent with fatty
infiltration. This is similar to that seen on the prior CT
examination. The liver is also enlarged in size. Phasic flow is
noted within the portal vein with a to and fro component.

IVC: No abnormality visualized.

Pancreas: Visualized portion unremarkable.

Spleen: Splenomegaly is noted. Splenic volume is approximately 1,500
cubic cm. Perisplenic varices are again noted.

Right Kidney: Length: 14.3 cm. Echogenicity within normal limits. No
mass or hydronephrosis visualized.

Left Kidney: Length: 14.9 cm. Echogenicity within normal limits. No
mass or hydronephrosis visualized.

Abdominal aorta: No aneurysm visualized.

Other findings: Mild ascites is noted.
IMPRESSION: Gallbladder is well distended with evidence of cholelithiasis and
gallbladder wall thickening. The wall thickening may be in part due
to the underlying ascites. No sonographic Murphy sign is noted.

Hepatosplenomegaly with diffuse fatty infiltration of the liver
similar to that seen on prior CT examination.

Perisplenic varices consistent with a degree of portal hypertension.

## 2017-05-06 IMAGING — US US RENAL
1 series · 14 of 25 positions shown · non-contrast
Comparison: None.

CLINICAL DATA: Acute kidney injury

EXAM:
RENAL / URINARY TRACT ULTRASOUND COMPLETE

[Series 1: us renal · 0.33mm/px · 14 of 37 slices shown]
[im 1/37]
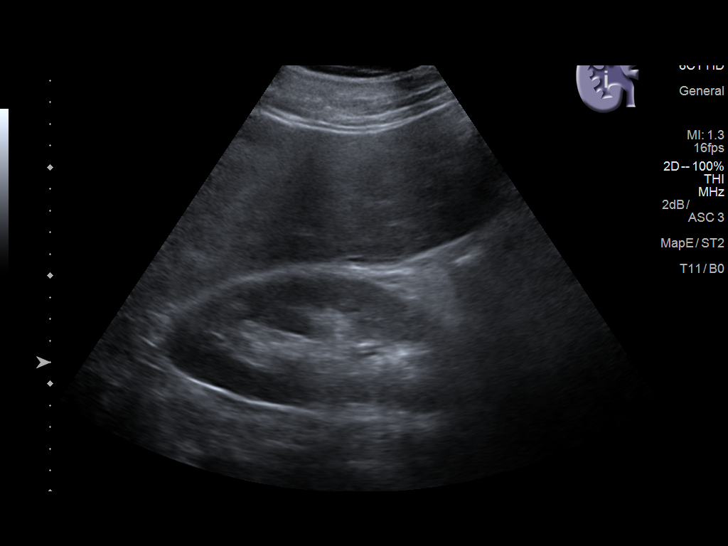
[im 4/37]
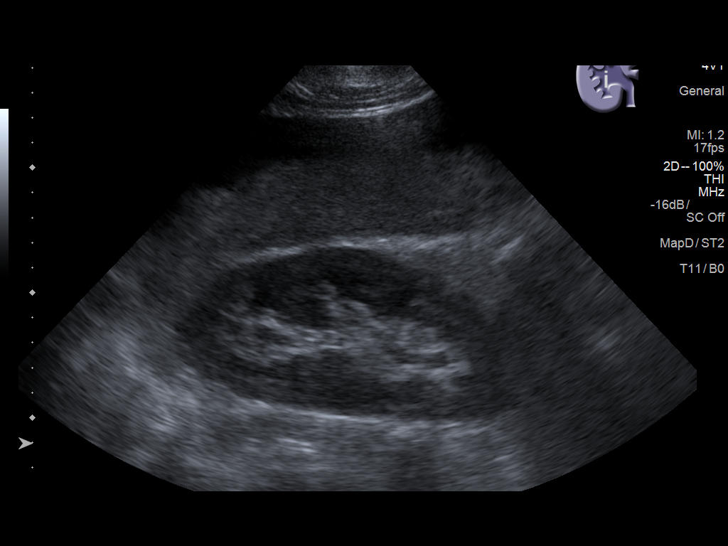
[im 7/37]
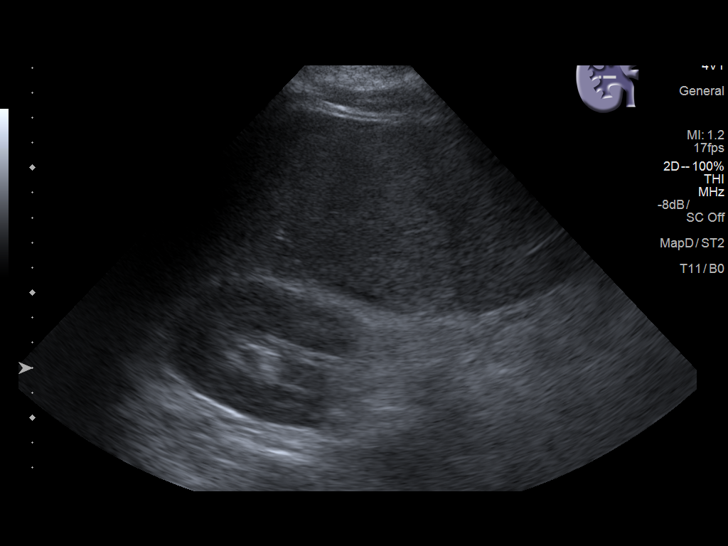
[im 10/37]
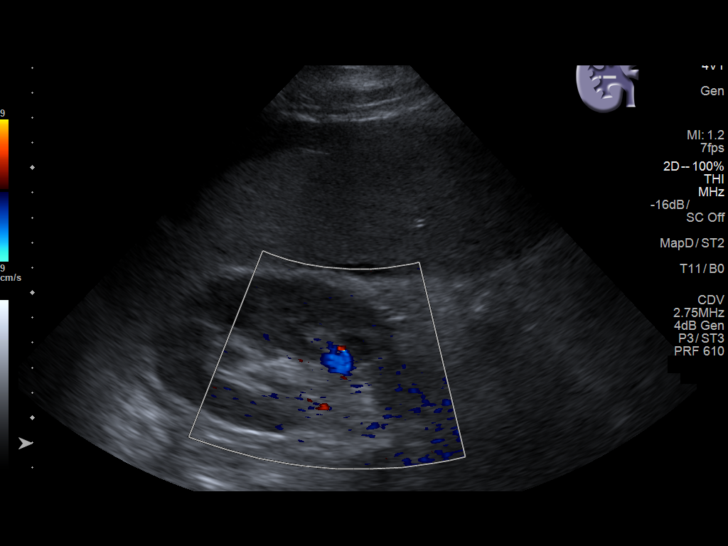
[im 13/37]
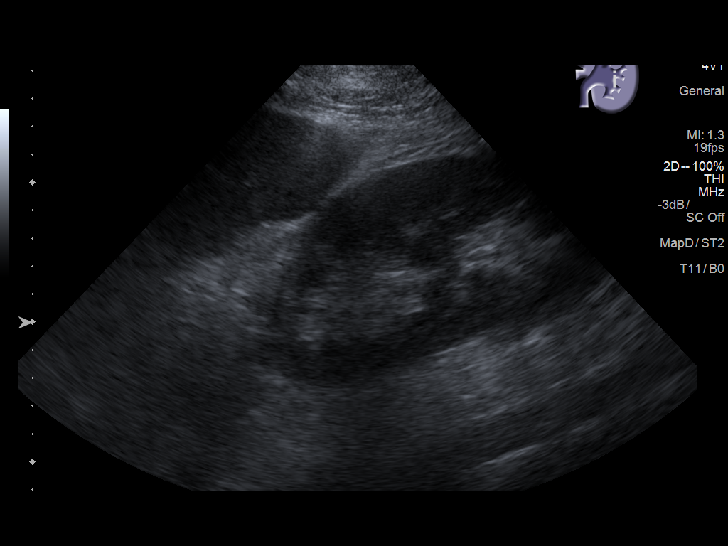
[im 14/37]
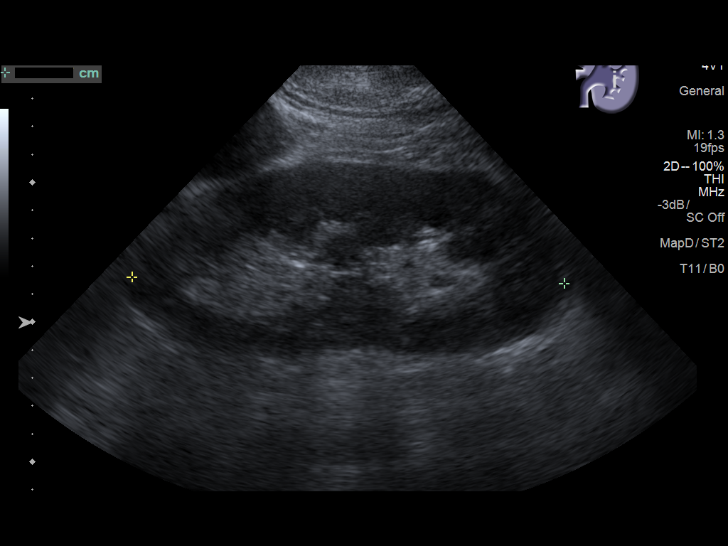
[im 17/37]
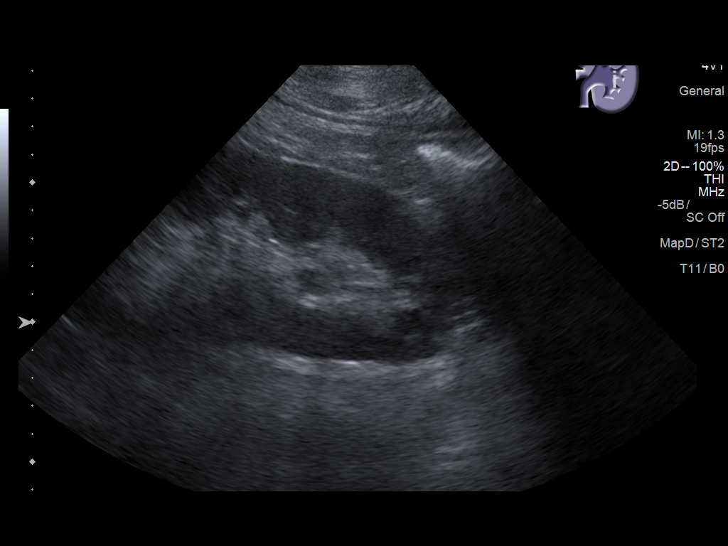
[im 20/37]
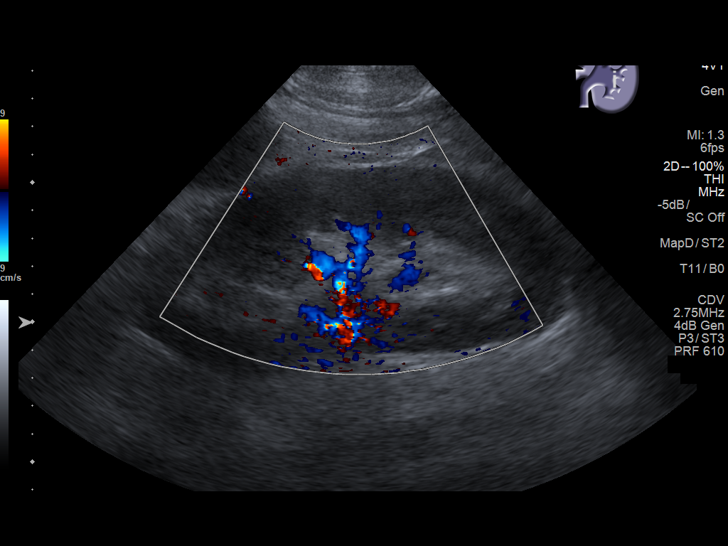
[im 23/37]
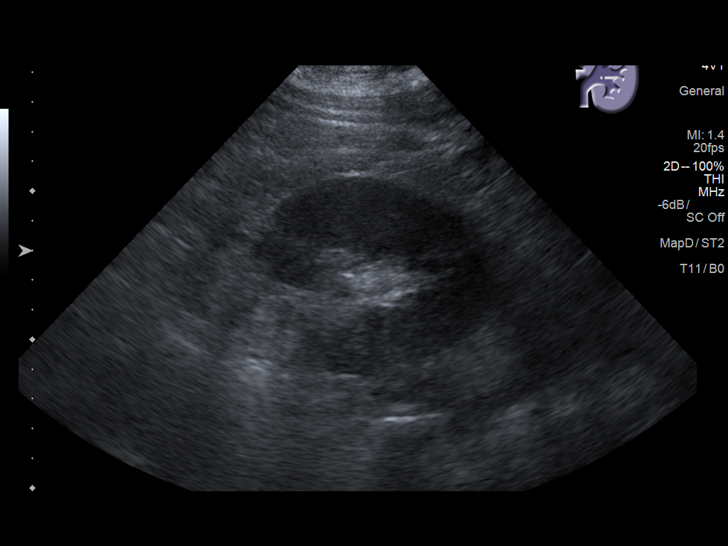
[im 25/37]
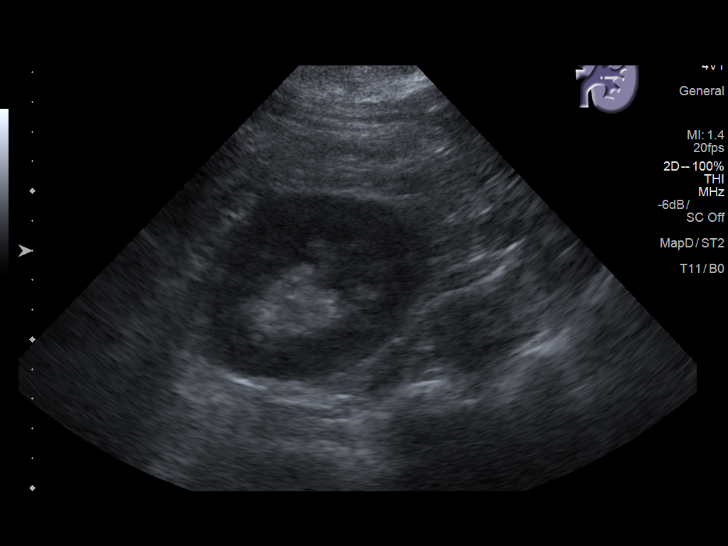
[im 28/37]
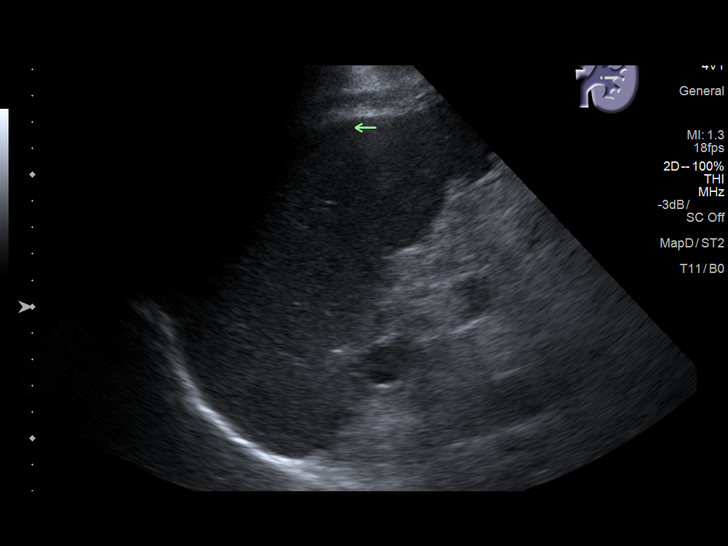
[im 31/37]
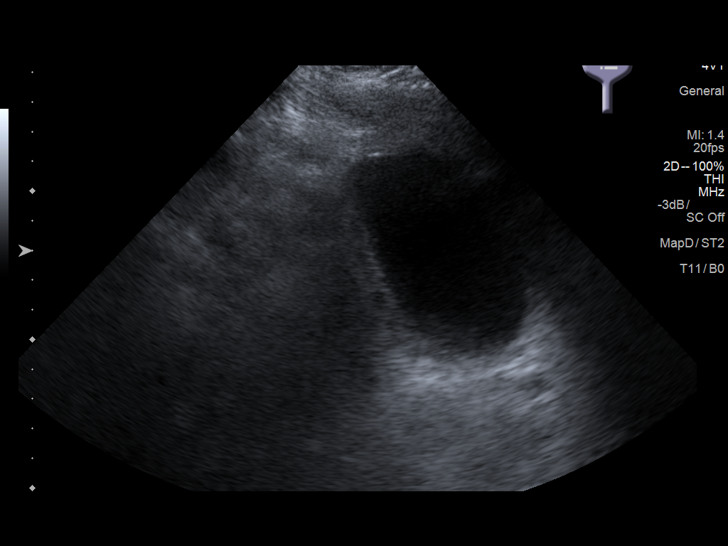
[im 34/37]
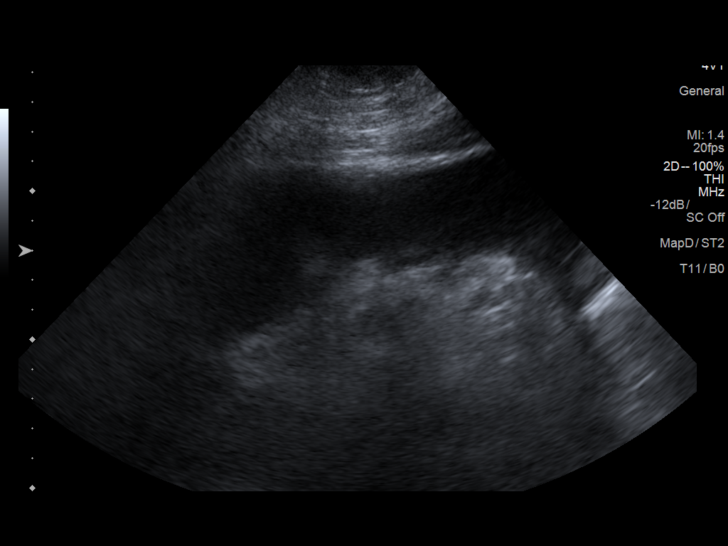
[im 37/37]
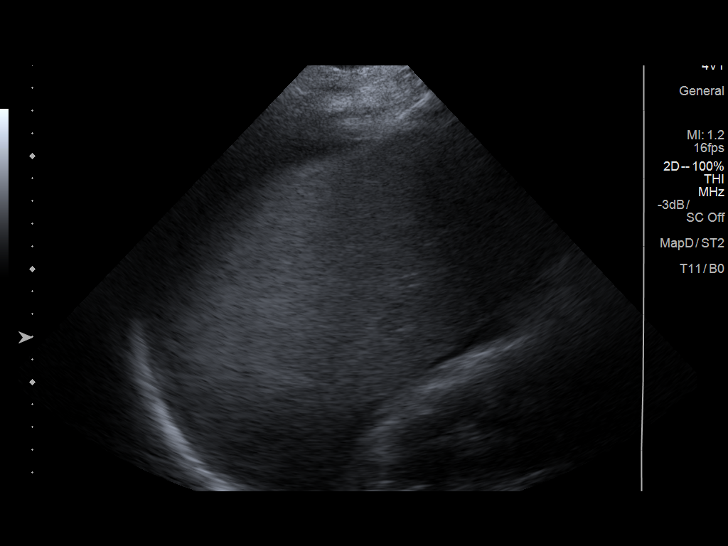

[14 of 25 positions shown; findings below may reference images not displayed]

FINDINGS: Right Kidney:

Length: 15.5 cm. Echogenicity within normal limits. No mass or
hydronephrosis visualized.

Left Kidney:

Length: 15.5 cm. Echogenicity within normal limits. No mass or
hydronephrosis visualized.

Bladder:

Appears normal for degree of bladder distention.

Peritoneal ascites is visible in the perihepatic space.
IMPRESSION: Normal kidneys and bladder.  Peritoneal ascites noted.

## 2021-04-08 ENCOUNTER — Other Ambulatory Visit: Payer: Self-pay | Admitting: Gastroenterology

## 2021-04-08 DIAGNOSIS — Z8719 Personal history of other diseases of the digestive system: Secondary | ICD-10-CM

## 2022-10-29 ENCOUNTER — Other Ambulatory Visit: Payer: Self-pay | Admitting: Gastroenterology

## 2022-10-29 DIAGNOSIS — K7030 Alcoholic cirrhosis of liver without ascites: Secondary | ICD-10-CM

## 2023-03-16 ENCOUNTER — Other Ambulatory Visit: Payer: Self-pay

## 2023-03-18 ENCOUNTER — Ambulatory Visit
Admission: RE | Admit: 2023-03-18 | Discharge: 2023-03-18 | Disposition: A | Discharge status: Home / Self Care | Payer: BC Managed Care – PPO | Source: Ambulatory Visit | Attending: Gastroenterology | Admitting: Gastroenterology

## 2023-03-18 DIAGNOSIS — K7030 Alcoholic cirrhosis of liver without ascites: Secondary | ICD-10-CM
